# Patient Record
Sex: Male | Born: 1970 | Race: White | Hispanic: No | Marital: Single | Smoking: Never smoker
Health system: Southern US, Community
[De-identification: ages and names within clinical notes are randomized; demographics above are authoritative.]

## PROBLEM LIST (undated history)

## (undated) DIAGNOSIS — E7800 Pure hypercholesterolemia, unspecified: Secondary | ICD-10-CM

## (undated) DIAGNOSIS — K829 Disease of gallbladder, unspecified: Secondary | ICD-10-CM

## (undated) DIAGNOSIS — I639 Cerebral infarction, unspecified: Secondary | ICD-10-CM

## (undated) DIAGNOSIS — E119 Type 2 diabetes mellitus without complications: Secondary | ICD-10-CM

## (undated) DIAGNOSIS — N189 Chronic kidney disease, unspecified: Secondary | ICD-10-CM

## (undated) DIAGNOSIS — M109 Gout, unspecified: Secondary | ICD-10-CM

## (undated) DIAGNOSIS — N200 Calculus of kidney: Secondary | ICD-10-CM

## (undated) DIAGNOSIS — I10 Essential (primary) hypertension: Secondary | ICD-10-CM

## (undated) HISTORY — PX: WISDOM TOOTH EXTRACTION: SHX21

---

## 2004-05-01 DIAGNOSIS — I639 Cerebral infarction, unspecified: Secondary | ICD-10-CM

## 2004-05-01 HISTORY — DX: Cerebral infarction, unspecified: I63.9

## 2015-02-25 ENCOUNTER — Emergency Department (HOSPITAL_BASED_OUTPATIENT_CLINIC_OR_DEPARTMENT_OTHER): Payer: 59

## 2015-02-25 ENCOUNTER — Encounter (HOSPITAL_BASED_OUTPATIENT_CLINIC_OR_DEPARTMENT_OTHER): Payer: Self-pay | Admitting: *Deleted

## 2015-02-25 ENCOUNTER — Emergency Department (HOSPITAL_BASED_OUTPATIENT_CLINIC_OR_DEPARTMENT_OTHER)
Admission: EM | Admit: 2015-02-25 | Discharge: 2015-02-25 | Disposition: A | Discharge status: Home / Self Care | Payer: 59 | Attending: Emergency Medicine | Admitting: Emergency Medicine

## 2015-02-25 DIAGNOSIS — N201 Calculus of ureter: Secondary | ICD-10-CM | POA: Insufficient documentation

## 2015-02-25 DIAGNOSIS — R1032 Left lower quadrant pain: Secondary | ICD-10-CM | POA: Diagnosis present

## 2015-02-25 DIAGNOSIS — R109 Unspecified abdominal pain: Secondary | ICD-10-CM

## 2015-02-25 LAB — CBC WITH DIFFERENTIAL/PLATELET
Basophils Absolute: 0 10*3/uL (ref 0.0–0.1)
Basophils Relative: 0 %
Eosinophils Absolute: 0.2 10*3/uL (ref 0.0–0.7)
Eosinophils Relative: 2 %
HCT: 45.5 % (ref 39.0–52.0)
HEMOGLOBIN: 15.3 g/dL (ref 13.0–17.0)
LYMPHS ABS: 1.9 10*3/uL (ref 0.7–4.0)
LYMPHS PCT: 20 %
MCH: 27.4 pg (ref 26.0–34.0)
MCHC: 33.6 g/dL (ref 30.0–36.0)
MCV: 81.5 fL (ref 78.0–100.0)
MONOS PCT: 6 %
Monocytes Absolute: 0.6 10*3/uL (ref 0.1–1.0)
NEUTROS PCT: 72 %
Neutro Abs: 6.8 10*3/uL (ref 1.7–7.7)
Platelets: 202 10*3/uL (ref 150–400)
RBC: 5.58 MIL/uL (ref 4.22–5.81)
RDW: 13.3 % (ref 11.5–15.5)
WBC: 9.4 10*3/uL (ref 4.0–10.5)

## 2015-02-25 LAB — URINALYSIS, ROUTINE W REFLEX MICROSCOPIC
BILIRUBIN URINE: NEGATIVE
GLUCOSE, UA: 100 mg/dL — AB
Ketones, ur: 15 mg/dL — AB
Leukocytes, UA: NEGATIVE
Nitrite: NEGATIVE
PROTEIN: NEGATIVE mg/dL
Specific Gravity, Urine: 1.015 (ref 1.005–1.030)
UROBILINOGEN UA: 0.2 mg/dL (ref 0.0–1.0)
pH: 5.5 (ref 5.0–8.0)

## 2015-02-25 LAB — URINE MICROSCOPIC-ADD ON

## 2015-02-25 LAB — COMPREHENSIVE METABOLIC PANEL
ALK PHOS: 78 U/L (ref 38–126)
ALT: 67 U/L — AB (ref 17–63)
ANION GAP: 6 (ref 5–15)
AST: 64 U/L — ABNORMAL HIGH (ref 15–41)
Albumin: 4.1 g/dL (ref 3.5–5.0)
BUN: 8 mg/dL (ref 6–20)
CALCIUM: 8.9 mg/dL (ref 8.9–10.3)
CO2: 25 mmol/L (ref 22–32)
CREATININE: 0.97 mg/dL (ref 0.61–1.24)
Chloride: 105 mmol/L (ref 101–111)
Glucose, Bld: 228 mg/dL — ABNORMAL HIGH (ref 65–99)
Potassium: 4.1 mmol/L (ref 3.5–5.1)
Sodium: 136 mmol/L (ref 135–145)
TOTAL PROTEIN: 8.1 g/dL (ref 6.5–8.1)
Total Bilirubin: 1 mg/dL (ref 0.3–1.2)

## 2015-02-25 LAB — LIPASE, BLOOD: LIPASE: 30 U/L (ref 11–51)

## 2015-02-25 MED ORDER — ONDANSETRON HCL 8 MG PO TABS
4.0000 mg | ORAL_TABLET | Freq: Once | ORAL | Status: AC
  Administered 2015-02-25: 4 mg via ORAL
  Filled 2015-02-25: qty 1

## 2015-02-25 MED ORDER — ONDANSETRON HCL 4 MG PO TABS: 4.0000 mg | ORAL_TABLET | Freq: Three times a day (TID) | ORAL | Status: DC | PRN

## 2015-02-25 MED ORDER — ONDANSETRON HCL 4 MG/2ML IJ SOLN
4.0000 mg | Freq: Once | INTRAMUSCULAR | Status: AC
  Administered 2015-02-25: 4 mg via INTRAVENOUS
  Filled 2015-02-25: qty 2

## 2015-02-25 MED ORDER — OXYCODONE-ACETAMINOPHEN 5-325 MG PO TABS: 1.0000 | ORAL_TABLET | Freq: Four times a day (QID) | ORAL | Status: DC | PRN

## 2015-02-25 MED ORDER — MORPHINE SULFATE (PF) 4 MG/ML IV SOLN
4.0000 mg | Freq: Once | INTRAVENOUS | Status: AC
  Administered 2015-02-25: 4 mg via INTRAVENOUS
  Filled 2015-02-25: qty 1

## 2015-02-25 MED ORDER — OXYCODONE-ACETAMINOPHEN 5-325 MG PO TABS
1.0000 | ORAL_TABLET | Freq: Once | ORAL | Status: AC
  Administered 2015-02-25: 1 via ORAL
  Filled 2015-02-25: qty 1

## 2015-02-25 NOTE — ED Notes (Signed)
Patient transported to Ultrasound 

## 2015-02-25 NOTE — ED Provider Notes (Signed)
CSN: [id number]     Arrival date & time 02/25/15  4098 History   First MD Initiated Contact with Patient 02/25/15 0902     Chief Complaint  Patient presents with  . Abdominal Pain     (Consider location/radiation/quality/duration/timing/severity/associated sxs/prior Treatment) Patient is a 44 y.o. male presenting with abdominal pain.  Abdominal Pain Pain location:  LLQ (Left groin) Pain quality: sharp   Pain radiates to:  Groin Pain severity:  Severe Onset quality:  Sudden Duration:  2 hours Timing:  Constant Progression:  Worsening Chronicity:  New Context comment:   pain first began when he was driving to work this morning. Prior to that, he felt well. Relieved by:  Nothing Worsened by:  Nothing tried Associated symptoms: nausea and vomiting   Associated symptoms: no constipation, no diarrhea, no fever and no hematuria     History reviewed. No pertinent past medical history. History reviewed. No pertinent past surgical history. No family history on file. Social History  Substance Use Topics  . Smoking status: None  . Smokeless tobacco: None  . Alcohol Use: None    Review of Systems  Constitutional: Negative for fever.  Gastrointestinal: Positive for nausea, vomiting and abdominal pain. Negative for diarrhea and constipation.  Genitourinary: Negative for hematuria.  All other systems reviewed and are negative.     Allergies  Review of patient's allergies indicates no known allergies.  Home Medications   Prior to Admission medications   Not on File   There were no vitals taken for this visit. Physical Exam  Constitutional: He is oriented to person, place, and time. He appears well-developed and well-nourished. No distress.  Uncomfortable  HENT:  Head: Normocephalic and atraumatic.  Mouth/Throat: Oropharynx is clear and moist.  Eyes: Conjunctivae are normal. Pupils are equal, round, and reactive to light. No scleral icterus.  Neck: Neck supple.    Cardiovascular: Normal rate, regular rhythm, normal heart sounds and intact distal pulses.   No murmur heard. Pulmonary/Chest: Effort normal and breath sounds normal. No stridor. No respiratory distress. He has no wheezes. He has no rales.  Abdominal: Soft. He exhibits no distension. There is no tenderness. Hernia confirmed negative in the right inguinal area and confirmed negative in the left inguinal area.    Genitourinary: Penis normal. Right testis shows no mass and no tenderness. Left testis shows tenderness. Left testis shows no mass. No discharge found.  Musculoskeletal: Normal range of motion. He exhibits no edema.  Neurological: He is alert and oriented to person, place, and time.  Skin: Skin is warm and dry. No rash noted.  Psychiatric: He has a normal mood and affect. His behavior is normal.  Nursing note and vitals reviewed.   ED Course  Procedures (including critical care time) Labs Review Labs Reviewed  COMPREHENSIVE METABOLIC PANEL - Abnormal; Notable for the following:    Glucose, Bld 228 (*)    AST 64 (*)    ALT 67 (*)    All other components within normal limits  URINALYSIS, ROUTINE W REFLEX MICROSCOPIC (NOT AT [facility]) - Abnormal; Notable for the following:    APPearance CLOUDY (*)    Glucose, UA 100 (*)    Hgb urine dipstick LARGE (*)    Ketones, ur 15 (*)    All other components within normal limits  URINE MICROSCOPIC-ADD ON - Abnormal; Notable for the following:    Bacteria, UA FEW (*)    All other components within normal limits  CBC WITH DIFFERENTIAL/PLATELET  LIPASE, BLOOD  Imaging Review [place] Scrotum  02/25/2015  CLINICAL DATA:  Left inguinal and scrotal region pain EXAM: SCROTAL ULTRASOUND DOPPLER ULTRASOUND OF THE TESTICLES TECHNIQUE: Complete ultrasound examination of the testicles, epididymis, and other scrotal structures was performed. Color and spectral Doppler ultrasound were also utilized to evaluate blood flow to the testicles. COMPARISON:   None. FINDINGS: Right testicle Measurements: 4.0 x 2.1 x 2.6 cm. No mass or microlithiasis visualized. Left testicle Measurements: 4.0 x 2.1 x 2.8 cm. No mass or microlithiasis visualized. Right epididymis: There is a 3 mm cyst in the head of the right epididymis. No inflammatory foci. The overall size and contour of the right epididymis is normal. Left epididymis:  Normal in size and appearance. Hydrocele: Minimal hydrocele on the left. No appreciable hydrocele on the right. Varicocele:  None visualized. Pulsed Doppler interrogation of both testes demonstrates normal low resistance arterial and venous waveforms bilaterally. The peak systolic velocity of the left testis is 5 cm/sec. The peak systolic velocity of the right testis is 5 cm/sec. There is no scrotal abscess or wall thickening on either side. IMPRESSION: Minimal left-sided hydrocele. 3 mm epididymal head cyst. Study otherwise unremarkable. No inflammatory foci identified. No intratesticular mass or torsion. Electronically Signed   By: Bretta BangWilliam  Woodruff III M.D.   On: 02/25/2015 10:19   [place] Art/ven Flow Abd Pelv Doppler  02/25/2015  CLINICAL DATA:  Left inguinal and scrotal region pain EXAM: SCROTAL ULTRASOUND DOPPLER ULTRASOUND OF THE TESTICLES TECHNIQUE: Complete ultrasound examination of the testicles, epididymis, and other scrotal structures was performed. Color and spectral Doppler ultrasound were also utilized to evaluate blood flow to the testicles. COMPARISON:  None. FINDINGS: Right testicle Measurements: 4.0 x 2.1 x 2.6 cm. No mass or microlithiasis visualized. Left testicle Measurements: 4.0 x 2.1 x 2.8 cm. No mass or microlithiasis visualized. Right epididymis: There is a 3 mm cyst in the head of the right epididymis. No inflammatory foci. The overall size and contour of the right epididymis is normal. Left epididymis:  Normal in size and appearance. Hydrocele: Minimal hydrocele on the left. No appreciable hydrocele on the right. Varicocele:   None visualized. Pulsed Doppler interrogation of both testes demonstrates normal low resistance arterial and venous waveforms bilaterally. The peak systolic velocity of the left testis is 5 cm/sec. The peak systolic velocity of the right testis is 5 cm/sec. There is no scrotal abscess or wall thickening on either side. IMPRESSION: Minimal left-sided hydrocele. 3 mm epididymal head cyst. Study otherwise unremarkable. No inflammatory foci identified. No intratesticular mass or torsion. Electronically Signed   By: Bretta BangWilliam  Woodruff III M.D.   On: 02/25/2015 10:19   Ct Renal Stone Study  02/25/2015  CLINICAL DATA:  Sudden onset left flank pain EXAM: CT ABDOMEN AND PELVIS WITHOUT CONTRAST TECHNIQUE: Multidetector CT imaging of the abdomen and pelvis was performed following the standard protocol without IV contrast. COMPARISON:  None. FINDINGS: Lower chest and abdominal wall:  Symmetric gynecomastia, mild. Coronary atherosclerosis, multi focal in the left circulation, age advanced. Hepatobiliary: Hepatic steatosis with central sparing.Rounded stone subtly present within the gallbladder lumen; no inflammatory changes. Pancreas: Unremarkable. Spleen: Unremarkable. Adrenals/Urinary Tract:  Negative adrenals. 7 mm stone at the left ureteral pelvic junction with mild left hydronephrosis and moderate asymmetric left perinephric edema. No additional urolithiasis. No right hydronephrosis. Unremarkable bladder. Reproductive:Negative. Stomach/Bowel: Extensive colonic diverticulosis for age, especially in the sigmoid. No obstruction. Vascular/Lymphatic: No acute vascular abnormality. Enlarged lymph nodes in the deep liver drainage, usually incidental/ reactive in isolation. Peritoneal: No  ascites or pneumoperitoneum. Musculoskeletal: Degenerative disc disease focally advanced at L5-S1 with endplate spurs causing advanced bilateral foraminal stenosis when combined with facet spurring. IMPRESSION: 1. Obstructing 7 mm stone at the  left UPJ. 2. Cholelithiasis. 3. Hepatic steatosis. 4. Colonic diverticulosis. 5. Premature coronary atherosclerosis. Electronically Signed   By: Marnee Spring M.D.   On: 02/25/2015 11:48   I have personally reviewed and evaluated these images and lab results as part of my medical decision-making.   EKG Interpretation None      MDM   Final diagnoses:  Left groin pain  Left sided abdominal pain  Left ureteral stone    Sudden onset left abdominal/left groin pain associated with vomiting. Patient very uncomfortable on exam. Exam shows tenderness to his left testicle, therefore will need to proceed with ultrasound to rule out testicular torsion. If negative, his symptoms could be secondary to kidney stones. Plan CT imaging if ultrasound negative. He notes that his symptoms do not seem the same as when he has had kidney stones in the past.  IV morphine, IV Zofran for symptom control.  Workup shows large left UPJ stone (7mm).  Pain and nausea were controlled.  UA without signs of infection.  Good renal function. Discussed case with Dr. Vernie Ammons who will help to secure him close follow-up.  Blake Divine, MD 02/25/15 [phone]

## 2015-02-25 NOTE — Discharge Instructions (Signed)
Kidney Stones °Kidney stones (urolithiasis) are deposits that form inside your kidneys. The intense pain is caused by the stone moving through the urinary tract. When the stone moves, the ureter goes into spasm around the stone. The stone is usually passed in the urine.  °CAUSES  °· A disorder that makes certain neck glands produce too much parathyroid hormone (primary hyperparathyroidism). °· A buildup of uric acid crystals, similar to gout in your joints. °· Narrowing (stricture) of the ureter. °· A kidney obstruction present at birth (congenital obstruction). °· Previous surgery on the kidney or ureters. °· Numerous kidney infections. °SYMPTOMS  °· Feeling sick to your stomach (nauseous). °· Throwing up (vomiting). °· Blood in the urine (hematuria). °· Pain that usually spreads (radiates) to the groin. °· Frequency or urgency of urination. °DIAGNOSIS  °· Taking a history and physical exam. °· Blood or urine tests. °· CT scan. °· Occasionally, an examination of the inside of the urinary bladder (cystoscopy) is performed. °TREATMENT  °· Observation. °· Increasing your fluid intake. °· Extracorporeal shock wave lithotripsy--This is a noninvasive procedure that uses shock waves to break up kidney stones. °· Surgery may be needed if you have severe pain or persistent obstruction. There are various surgical procedures. Most of the procedures are performed with the use of small instruments. Only small incisions are needed to accommodate these instruments, so recovery time is minimized. °The size, location, and chemical composition are all important variables that will determine the proper choice of action for you. Talk to your health care provider to better understand your situation so that you will minimize the risk of injury to yourself and your kidney.  °HOME CARE INSTRUCTIONS  °· Drink enough water and fluids to keep your urine clear or pale yellow. This will help you to pass the stone or stone fragments. °· Strain  all urine through the provided strainer. Keep all particulate matter and stones for your health care provider to see. The stone causing the pain may be as small as a grain of salt. It is very important to use the strainer each and every time you pass your urine. The collection of your stone will allow your health care provider to analyze it and verify that a stone has actually passed. The stone analysis will often identify what you can do to reduce the incidence of recurrences. °· Only take over-the-counter or prescription medicines for pain, discomfort, or fever as directed by your health care provider. °· Keep all follow-up visits as told by your health care provider. This is important. °· Get follow-up X-rays if required. The absence of pain does not always mean that the stone has passed. It may have only stopped moving. If the urine remains completely obstructed, it can cause loss of kidney function or even complete destruction of the kidney. It is your responsibility to make sure X-rays and follow-ups are completed. Ultrasounds of the kidney can show blockages and the status of the kidney. Ultrasounds are not associated with any radiation and can be performed easily in a matter of minutes. °· Make changes to your daily diet as told by your health care provider. You may be told to: °¨ Limit the amount of salt that you eat. °¨ Eat 5 or more servings of fruits and vegetables each day. °¨ Limit the amount of meat, poultry, fish, and eggs that you eat. °· Collect a 24-hour urine sample as told by your health care provider. You may need to collect another urine sample every 6-12   months. °SEEK MEDICAL CARE IF: °· You experience pain that is progressive and unresponsive to any pain medicine you have been prescribed. °SEEK IMMEDIATE MEDICAL CARE IF:  °· Pain cannot be controlled with the prescribed medicine. °· You have a fever or shaking chills. °· The severity or intensity of pain increases over 18 hours and is not  relieved by pain medicine. °· You develop a new onset of abdominal pain. °· You feel faint or pass out. °· You are unable to urinate. °  °This information is not intended to replace advice given to you by your health care provider. Make sure you discuss any questions you have with your health care provider. °  °Document Released: 04/17/2005 Document Revised: 01/06/2015 Document Reviewed: 09/18/2012 °Elsevier Interactive Patient Education ©2016 [employer] ° °

## 2015-02-25 NOTE — ED Notes (Signed)
Pt amb to room 6 with quick steady gait, pt reports sudden onset of left upper abd pain radiating to his lower left abd and into his groin, pt states the pain is intermittent, and so intense that it made him vomit. Pain now 6/10, was 10/10.

## 2015-03-11 ENCOUNTER — Other Ambulatory Visit: Payer: Self-pay | Admitting: Urology

## 2015-03-12 ENCOUNTER — Encounter (HOSPITAL_COMMUNITY): Payer: Self-pay | Admitting: *Deleted

## 2015-03-15 ENCOUNTER — Ambulatory Visit (HOSPITAL_COMMUNITY)
Admission: RE | Admit: 2015-03-15 | Discharge: 2015-03-15 | Disposition: A | Discharge status: Home / Self Care | Payer: 59 | Source: Ambulatory Visit | Attending: Urology | Admitting: Urology

## 2015-03-15 ENCOUNTER — Encounter (HOSPITAL_COMMUNITY)
Admission: RE | Disposition: A | Discharge status: Home / Self Care | Payer: Self-pay | Source: Ambulatory Visit | Attending: Urology

## 2015-03-15 ENCOUNTER — Encounter (HOSPITAL_COMMUNITY): Payer: Self-pay | Admitting: General Practice

## 2015-03-15 ENCOUNTER — Ambulatory Visit (HOSPITAL_COMMUNITY): Payer: 59

## 2015-03-15 DIAGNOSIS — I10 Essential (primary) hypertension: Secondary | ICD-10-CM | POA: Diagnosis not present

## 2015-03-15 DIAGNOSIS — K8080 Other cholelithiasis without obstruction: Secondary | ICD-10-CM | POA: Diagnosis not present

## 2015-03-15 DIAGNOSIS — M109 Gout, unspecified: Secondary | ICD-10-CM | POA: Diagnosis not present

## 2015-03-15 DIAGNOSIS — Z87442 Personal history of urinary calculi: Secondary | ICD-10-CM | POA: Insufficient documentation

## 2015-03-15 DIAGNOSIS — Z841 Family history of disorders of kidney and ureter: Secondary | ICD-10-CM | POA: Diagnosis not present

## 2015-03-15 DIAGNOSIS — N201 Calculus of ureter: Secondary | ICD-10-CM | POA: Diagnosis present

## 2015-03-15 DIAGNOSIS — Z79891 Long term (current) use of opiate analgesic: Secondary | ICD-10-CM | POA: Insufficient documentation

## 2015-03-15 DIAGNOSIS — Z79899 Other long term (current) drug therapy: Secondary | ICD-10-CM | POA: Diagnosis not present

## 2015-03-15 DIAGNOSIS — Z8673 Personal history of transient ischemic attack (TIA), and cerebral infarction without residual deficits: Secondary | ICD-10-CM | POA: Insufficient documentation

## 2015-03-15 HISTORY — DX: Chronic kidney disease, unspecified: N18.9

## 2015-03-15 HISTORY — DX: Cerebral infarction, unspecified: I63.9

## 2015-03-15 HISTORY — DX: Gout, unspecified: M10.9

## 2015-03-15 HISTORY — DX: Calculus of kidney: N20.0

## 2015-03-15 SURGERY — LITHOTRIPSY, ESWL
Anesthesia: LOCAL | Laterality: Left

## 2015-03-15 MED ORDER — DIPHENHYDRAMINE HCL 25 MG PO CAPS
25.0000 mg | ORAL_CAPSULE | ORAL | Status: AC
  Administered 2015-03-15: 25 mg via ORAL
  Filled 2015-03-15: qty 1

## 2015-03-15 MED ORDER — DIAZEPAM 5 MG PO TABS
10.0000 mg | ORAL_TABLET | ORAL | Status: AC
  Administered 2015-03-15: 10 mg via ORAL
  Filled 2015-03-15: qty 2

## 2015-03-15 MED ORDER — SODIUM CHLORIDE 0.9 % IV SOLN
INTRAVENOUS | Status: DC
  Administered 2015-03-15: 10:00:00 via INTRAVENOUS

## 2015-03-15 MED ORDER — CIPROFLOXACIN HCL 500 MG PO TABS
500.0000 mg | ORAL_TABLET | ORAL | Status: AC
  Administered 2015-03-15: 500 mg via ORAL
  Filled 2015-03-15: qty 1

## 2015-03-15 NOTE — Progress Notes (Signed)
Procedure cancelled on mobile lithotripsy van, pt.'s BP elevated on litho van, pt. Does not take any BP medications, pt. To follow-up with primary doctor regarding BP. Pt. Had sedation  On litho van. Pt. To reschedule with Dr. Jorja LoaBudzyn's office.

## 2017-01-11 ENCOUNTER — Observation Stay (HOSPITAL_BASED_OUTPATIENT_CLINIC_OR_DEPARTMENT_OTHER)
Admission: EM | Admit: 2017-01-11 | Discharge: 2017-01-13 | Disposition: A | Discharge status: Home / Self Care | Payer: 59 | Attending: Internal Medicine | Admitting: Internal Medicine

## 2017-01-11 ENCOUNTER — Encounter (HOSPITAL_BASED_OUTPATIENT_CLINIC_OR_DEPARTMENT_OTHER): Payer: Self-pay

## 2017-01-11 ENCOUNTER — Emergency Department (HOSPITAL_BASED_OUTPATIENT_CLINIC_OR_DEPARTMENT_OTHER): Payer: 59

## 2017-01-11 DIAGNOSIS — R29810 Facial weakness: Secondary | ICD-10-CM | POA: Insufficient documentation

## 2017-01-11 DIAGNOSIS — Z8673 Personal history of transient ischemic attack (TIA), and cerebral infarction without residual deficits: Secondary | ICD-10-CM | POA: Insufficient documentation

## 2017-01-11 DIAGNOSIS — N189 Chronic kidney disease, unspecified: Secondary | ICD-10-CM | POA: Insufficient documentation

## 2017-01-11 DIAGNOSIS — Z7982 Long term (current) use of aspirin: Secondary | ICD-10-CM | POA: Diagnosis not present

## 2017-01-11 DIAGNOSIS — Z79899 Other long term (current) drug therapy: Secondary | ICD-10-CM | POA: Insufficient documentation

## 2017-01-11 DIAGNOSIS — E1122 Type 2 diabetes mellitus with diabetic chronic kidney disease: Secondary | ICD-10-CM | POA: Insufficient documentation

## 2017-01-11 DIAGNOSIS — Z8249 Family history of ischemic heart disease and other diseases of the circulatory system: Secondary | ICD-10-CM | POA: Diagnosis not present

## 2017-01-11 DIAGNOSIS — Z833 Family history of diabetes mellitus: Secondary | ICD-10-CM | POA: Diagnosis not present

## 2017-01-11 DIAGNOSIS — I129 Hypertensive chronic kidney disease with stage 1 through stage 4 chronic kidney disease, or unspecified chronic kidney disease: Secondary | ICD-10-CM | POA: Diagnosis not present

## 2017-01-11 DIAGNOSIS — Z7984 Long term (current) use of oral hypoglycemic drugs: Secondary | ICD-10-CM | POA: Diagnosis not present

## 2017-01-11 DIAGNOSIS — M109 Gout, unspecified: Secondary | ICD-10-CM | POA: Insufficient documentation

## 2017-01-11 DIAGNOSIS — R079 Chest pain, unspecified: Secondary | ICD-10-CM | POA: Diagnosis not present

## 2017-01-11 DIAGNOSIS — Z87442 Personal history of urinary calculi: Secondary | ICD-10-CM | POA: Diagnosis not present

## 2017-01-11 LAB — CBC WITH DIFFERENTIAL/PLATELET
BASOS PCT: 0 %
Basophils Absolute: 0 10*3/uL (ref 0.0–0.1)
EOS ABS: 0.1 10*3/uL (ref 0.0–0.7)
EOS PCT: 1 %
HCT: 42.5 % (ref 39.0–52.0)
HEMOGLOBIN: 14.7 g/dL (ref 13.0–17.0)
Lymphocytes Relative: 20 %
Lymphs Abs: 2.2 10*3/uL (ref 0.7–4.0)
MCH: 28 pg (ref 26.0–34.0)
MCHC: 34.6 g/dL (ref 30.0–36.0)
MCV: 81 fL (ref 78.0–100.0)
MONOS PCT: 6 %
Monocytes Absolute: 0.6 10*3/uL (ref 0.1–1.0)
NEUTROS PCT: 73 %
Neutro Abs: 7.8 10*3/uL — ABNORMAL HIGH (ref 1.7–7.7)
PLATELETS: 180 10*3/uL (ref 150–400)
RBC: 5.25 MIL/uL (ref 4.22–5.81)
RDW: 13.2 % (ref 11.5–15.5)
WBC: 10.7 10*3/uL — ABNORMAL HIGH (ref 4.0–10.5)

## 2017-01-11 LAB — COMPREHENSIVE METABOLIC PANEL
ALBUMIN: 4 g/dL (ref 3.5–5.0)
ALT: 50 U/L (ref 17–63)
ANION GAP: 9 (ref 5–15)
AST: 41 U/L (ref 15–41)
Alkaline Phosphatase: 80 U/L (ref 38–126)
BUN: 9 mg/dL (ref 6–20)
CHLORIDE: 99 mmol/L — AB (ref 101–111)
CO2: 24 mmol/L (ref 22–32)
Calcium: 8.9 mg/dL (ref 8.9–10.3)
Creatinine, Ser: 0.77 mg/dL (ref 0.61–1.24)
GFR calc Af Amer: >60 mL/min (ref >60)
GFR calc non Af Amer: >60 mL/min (ref >60)
GLUCOSE: 222 mg/dL — AB (ref 65–99)
POTASSIUM: 4.2 mmol/L (ref 3.5–5.1)
SODIUM: 132 mmol/L — AB (ref 135–145)
Total Bilirubin: 0.3 mg/dL (ref 0.3–1.2)
Total Protein: 7.7 g/dL (ref 6.5–8.1)

## 2017-01-11 LAB — TROPONIN I
Troponin I: <0.03 ng/mL (ref <0.03)
Troponin I: <0.03 ng/mL (ref <0.03)

## 2017-01-11 MED ORDER — ASPIRIN 81 MG PO CHEW
324.0000 mg | CHEWABLE_TABLET | Freq: Once | ORAL | Status: AC
  Administered 2017-01-11: 324 mg via ORAL
  Filled 2017-01-11: qty 4

## 2017-01-11 MED ORDER — NITROGLYCERIN 0.4 MG SL SUBL
0.4000 mg | SUBLINGUAL_TABLET | SUBLINGUAL | Status: DC | PRN
  Administered 2017-01-11: 0.4 mg via SUBLINGUAL
  Filled 2017-01-11: qty 1

## 2017-01-11 NOTE — ED Provider Notes (Signed)
[facility] Provider Note   CSN: [id number] Arrival date & time: 01/11/17  1926     History   Chief Complaint Chief Complaint  Patient presents with  . Chest Pain    HPI Tony Bryan is a 46 y.o. male.  HPI   [age]yo male presents with concern for left sided chest pain intermittently for 3 days. Radiated to neck, arm. Associated pressure and tingling.  Today arm felt a heaviness, tingling and numbness with chest pressure. Could move arm ok, but it just had a sensation of being heavy. Feels chest pressure.  Was coming and going over the last few days. Was taking aspirin and it seemed to subside, sometimes better with rest. Not necessarily worse with exertion however.  Was at work the last few days and didn' t notice it but when got home did notice it.   Mild associated shortness of breath.  No nausea, no vomiting, no sweating. No difficulty speaking, trouble walking, weakness/difficulty moving arm or legs.    No prior hx of chest pain, no hx of stress test.  Hx of TIA when 34-had trouble speaking and facial droop, was admitted to the hospital for 3 days  No htn, hlpd, DM, no smoking No recent surgeries, had colonoscopy this year Mom had hx of CHF, no known hx of MI   Past Medical History:  Diagnosis Date  . Chronic kidney disease   . Gout    right ankle  . Renal stones   . Stroke [facility]) 2006   TIA    Patient Active Problem List   Diagnosis Date Noted  . Chest pain 01/11/2017    Past Surgical History:  Procedure Laterality Date  . WISDOM TOOTH EXTRACTION         Home Medications    Prior to Admission medications   Not on File    Family History History reviewed. No pertinent family history.  Social History Social History  Substance Use Topics  . Smoking status: Never Smoker  . Smokeless tobacco: Never Used  . Alcohol use Yes     Comment: occ     Allergies   Patient has no known allergies.   Review of Systems Review of Systems    Constitutional: Negative for fever.  HENT: Negative for sore throat.   Eyes: Negative for visual disturbance.  Respiratory: Positive for shortness of breath. Negative for cough.   Cardiovascular: Positive for chest pain. Negative for leg swelling.  Gastrointestinal: Negative for abdominal pain, nausea and vomiting.  Genitourinary: Negative for difficulty urinating and dysuria.  Musculoskeletal: Negative for back pain and neck stiffness.  Skin: Negative for rash.  Neurological: Positive for light-headedness and numbness (tingling). Negative for syncope and headaches.     Physical Exam Updated Vital Signs BP 135/89   Pulse 69   Temp 98.4 F (36.9 C) (Oral)   Resp 17   Ht 5\' 10"  (1.778 m)   Wt 119.7 kg (263 lb 14.3 oz)   SpO2 99%   BMI 37.86 kg/m   Physical Exam  Constitutional: He is oriented to person, place, and time. He appears well-developed and well-nourished. No distress.  HENT:  Head: Normocephalic and atraumatic.  Eyes: Conjunctivae and EOM are normal.  Neck: Normal range of motion.  Cardiovascular: Normal rate, regular rhythm, normal heart sounds and intact distal pulses.  Exam reveals no gallop and no friction rub.   No murmur heard. Pulmonary/Chest: Effort normal and breath sounds normal. No respiratory distress. He has no  wheezes. He has no rales. He exhibits no tenderness.  Abdominal: Soft. He exhibits no distension. There is no tenderness. There is no guarding.  Musculoskeletal: He exhibits no edema.  Neurological: He is alert and oriented to person, place, and time.  Skin: Skin is warm and dry. He is not diaphoretic.  Nursing note and vitals reviewed.    ED Treatments / Results  Labs (all labs ordered are listed, but only abnormal results are displayed) Labs Reviewed  CBC WITH DIFFERENTIAL/PLATELET - Abnormal; Notable for the following:       Result Value   WBC 10.7 (*)    Neutro Abs 7.8 (*)    All other components within normal limits  COMPREHENSIVE  METABOLIC PANEL - Abnormal; Notable for the following:    Sodium 132 (*)    Chloride 99 (*)    Glucose, Bld 222 (*)    All other components within normal limits  TROPONIN I  TROPONIN I  TROPONIN I    EKG  EKG Interpretation  Date/Time:  Thursday January 11 2017 19:33:25 EDT Ventricular Rate:  86 PR Interval:  142 QRS Duration: 82 QT Interval:  342 QTC Calculation: 409 R Axis:   28 Text Interpretation:  Normal sinus rhythm Cannot rule out Anterior infarct , age undetermined Abnormal ECG No previous ECGs available Confirmed by Alvira Monday ([id number]) on 01/11/2017 7:53:03 PM       Radiology Dg Chest 2 View  Result Date: 01/11/2017 CLINICAL DATA:  Chest pain and tightness. EXAM: CHEST  2 VIEW COMPARISON:  None. FINDINGS: Normal heart size and mediastinal contours. There is artifact from EKG pads, including over the upper left chest. There is no edema, consolidation, effusion, or pneumothorax. No acute osseous finding. IMPRESSION: Negative chest. Electronically Signed   By: Marnee Spring M.D.   On: 01/11/2017 20:41    Procedures Procedures (including critical care time)  Medications Ordered in ED Medications  nitroGLYCERIN (NITROSTAT) SL tablet 0.4 mg (0.4 mg Sublingual Given 01/11/17 2048)  aspirin chewable tablet 324 mg (324 mg Oral Given 01/11/17 2003)     Initial Impression / Assessment and Plan / ED Course  I have reviewed the triage vital signs and the nursing notes.  Pertinent labs & imaging results that were available during my care of the patient were reviewed by me and considered in my medical decision making (see chart for details).     46 year old male presents with concern for 3 days of intermittent left-sided chest pressure with radiation to the arm and neck with associated shortness of breath. EKG shows age indeterminate anterior changes.  Troponin negative.  CXR without acute abnormalities.  Patient low-risk Wells and perc negative and her low suspicion  for pulmonary embolus. He has strong bilateral upper and lower certainly pulses, normal CHEST X-RAY, the history, physical or imaging are not consistent with aortic dissection.  Given his elevated BMI, history of pain, patient's heart score is 4 on my evaluation. Also question whether he has DM given elevated glucose 222.  Discussed this with patient and his sister. Do not have other explanation for chest pain at this time.  HEART score 4, will plan on observation admission.   He is currently awaiting admission at [facility], Dr. Robb Matar.    Final Clinical Impressions(s) / ED Diagnoses   Final diagnoses:  Nonspecific chest pain    New Prescriptions New Prescriptions   No medications on file     Alvira Monday, MD 01/12/17 [phone]

## 2017-01-11 NOTE — ED Triage Notes (Signed)
C/o CP x 3 days-NAD-steady gait 

## 2017-01-11 NOTE — ED Notes (Signed)
Chest tightness since Monday, with intermittent ShOB. Pt reports that sometimes the pain gets better with ASA.

## 2017-01-11 NOTE — Plan of Care (Signed)
Accepted to observation/telemetry bed for chest pain monitoring and evaluation.   Per Dr. Dalene SeltzerSchlossman,  Chief Complaint    Chief Complaint  Patient presents with  . Chest Pain    HPI Tony Bryan is a 46 y.o. male.  HPI   46yo male presents with concern for left sided chest pain intermittently for 3 days. Radiated to neck, arm. Associated pressure and tingling.  Today arm felt a heaviness, tingling and numbness with chest pressure. Could move arm ok, but it just had a sensation of being heavy. Feels chest pressure.  Was coming and going over the last few days. Was taking aspirin and it seemed to subside, sometimes better with rest. Not necessarily worse with exertion however.  Was at work the last few days and didn' t notice it but when got home did notice it.   Mild associated shortness of breath.  No nausea, no vomiting, no sweating. No difficulty speaking, trouble walking, weakness/difficulty moving arm or legs.    No prior hx of chest pain, no hx of stress test.  Hx of TIA when 34-had trouble speaking and facial droop, was admitted to the hospital for 3 days  No htn, hlpd, DM, no smoking No recent surgeries, had colonoscopy this year Mom had hx of CHF, no known hx of MI  Troponin I [[id number]][[id number]] Collected: 01/11/17 2006  Updated: 01/11/17 2042   Specimen Type: Blood   Specimen Source: Vein    Troponin I <0.03 ng/mL  Comprehensive metabolic panel [[id number]][[id number]] (Abnormal) Collected: 01/11/17 2006  Updated: 01/11/17 2038   Specimen Type: Blood   Specimen Source: Vein    Sodium 132 (L) mmol/L   Potassium 4.2 mmol/L   Chloride 99 (L) mmol/L   CO2 24 mmol/L   Glucose, Bld 222 (H) mg/dL   BUN 9 mg/dL   Creatinine, Ser 6.290.77 mg/dL   Calcium 8.9 mg/dL   Total Protein 7.7 g/dL   Albumin 4.0 g/dL   AST 41 U/L   ALT 50 U/L   Alkaline Phosphatase 80 U/L   Total Bilirubin 0.3 mg/dL   GFR calc non Af Amer >60 mL/min   GFR calc Af Amer >60 mL/min   Anion gap 9  CBC  with Differential [[id number]][[id number]] (Abnormal) Collected: 01/11/17 2006  Updated: 01/11/17 2023   Specimen Type: Blood   Specimen Source: Vein    WBC 10.7 (H) K/uL   RBC 5.25 MIL/uL   Hemoglobin 14.7 g/dL   HCT 01.042.5 %   MCV 27.281.0 fL   MCH 28.0 pg   MCHC 34.6 g/dL   RDW 53.613.2 %   Platelets 180 K/uL   Neutrophils Relative % 73 %   Neutro Abs 7.8 (H) K/uL   Lymphocytes Relative 20 %   Lymphs Abs 2.2 K/uL   Monocytes Relative 6 %   Monocytes Absolute 0.6 K/uL   Eosinophils Relative 1 %   Eosinophils Absolute 0.1 K/uL   Basophils Relative 0 %   Basophils Absolute 0.0 K/uL   EKG Vent. rate 86 BPM PR interval 142 ms QRS duration 82 ms QT/QTc 342/409 ms P-R-T axes 35 28 34 Normal sinus rhythm Cannot rule out Anterior infarct , age undetermined Abnormal ECG No previous tracings to compare with  His chest radiograph was negative.   Tony Bryan, M.D.

## 2017-01-11 NOTE — ED Notes (Signed)
ED Provider at bedside. 

## 2017-01-12 ENCOUNTER — Encounter (HOSPITAL_COMMUNITY): Payer: Self-pay | Admitting: Internal Medicine

## 2017-01-12 ENCOUNTER — Telehealth: Payer: Self-pay | Admitting: Cardiology

## 2017-01-12 ENCOUNTER — Observation Stay (HOSPITAL_BASED_OUTPATIENT_CLINIC_OR_DEPARTMENT_OTHER): Payer: 59

## 2017-01-12 DIAGNOSIS — R079 Chest pain, unspecified: Secondary | ICD-10-CM | POA: Diagnosis not present

## 2017-01-12 LAB — TROPONIN I

## 2017-01-12 LAB — HEMOGLOBIN A1C
HEMOGLOBIN A1C: 9.4 % — AB (ref 4.8–5.6)
HEMOGLOBIN A1C: 9.5 % — AB (ref 4.8–5.6)
MEAN PLASMA GLUCOSE: 225.95 mg/dL
Mean Plasma Glucose: 223.08 mg/dL

## 2017-01-12 LAB — LIPID PANEL
CHOL/HDL RATIO: 8.1 ratio
Cholesterol: 186 mg/dL (ref 0–200)
Cholesterol: 202 mg/dL — ABNORMAL HIGH (ref 0–200)
HDL: 22 mg/dL — ABNORMAL LOW (ref >40)
HDL: 25 mg/dL — ABNORMAL LOW (ref >40)
LDL CALC: 109 mg/dL — AB (ref 0–99)
LDL Cholesterol: 127 mg/dL — ABNORMAL HIGH (ref 0–99)
Total CHOL/HDL Ratio: 8.5 RATIO
Triglycerides: 275 mg/dL — ABNORMAL HIGH (ref <150)
Triglycerides: 252 mg/dL — ABNORMAL HIGH (ref <150)
VLDL: 55 mg/dL — AB (ref 0–40)
VLDL: 50 mg/dL — ABNORMAL HIGH (ref 0–40)

## 2017-01-12 LAB — CBC
HCT: 42.3 % (ref 39.0–52.0)
HEMOGLOBIN: 14.2 g/dL (ref 13.0–17.0)
MCH: 27.4 pg (ref 26.0–34.0)
MCHC: 33.6 g/dL (ref 30.0–36.0)
MCV: 81.5 fL (ref 78.0–100.0)
Platelets: 165 10*3/uL (ref 150–400)
RBC: 5.19 MIL/uL (ref 4.22–5.81)
RDW: 13.1 % (ref 11.5–15.5)
WBC: 7.7 10*3/uL (ref 4.0–10.5)

## 2017-01-12 LAB — CBC WITH DIFFERENTIAL/PLATELET
BASOS ABS: 0 10*3/uL (ref 0.0–0.1)
BASOS PCT: 0 %
EOS PCT: 2 %
Eosinophils Absolute: 0.1 10*3/uL (ref 0.0–0.7)
HCT: 42.8 % (ref 39.0–52.0)
Hemoglobin: 14.7 g/dL (ref 13.0–17.0)
LYMPHS PCT: 29 %
Lymphs Abs: 2.3 10*3/uL (ref 0.7–4.0)
MCH: 27.9 pg (ref 26.0–34.0)
MCHC: 34.3 g/dL (ref 30.0–36.0)
MCV: 81.2 fL (ref 78.0–100.0)
MONO ABS: 0.5 10*3/uL (ref 0.1–1.0)
Monocytes Relative: 6 %
Neutro Abs: 5 10*3/uL (ref 1.7–7.7)
Neutrophils Relative %: 63 %
PLATELETS: 157 10*3/uL (ref 150–400)
RBC: 5.27 MIL/uL (ref 4.22–5.81)
RDW: 13.1 % (ref 11.5–15.5)
WBC: 7.9 10*3/uL (ref 4.0–10.5)

## 2017-01-12 LAB — COMPREHENSIVE METABOLIC PANEL
ALT: 43 U/L (ref 17–63)
AST: 34 U/L (ref 15–41)
Albumin: 3.6 g/dL (ref 3.5–5.0)
Alkaline Phosphatase: 76 U/L (ref 38–126)
Anion gap: 10 (ref 5–15)
BILIRUBIN TOTAL: 1.1 mg/dL (ref 0.3–1.2)
BUN: 10 mg/dL (ref 6–20)
CHLORIDE: 103 mmol/L (ref 101–111)
CO2: 23 mmol/L (ref 22–32)
Calcium: 8.8 mg/dL — ABNORMAL LOW (ref 8.9–10.3)
Creatinine, Ser: 0.7 mg/dL (ref 0.61–1.24)
Glucose, Bld: 197 mg/dL — ABNORMAL HIGH (ref 65–99)
Potassium: 3.7 mmol/L (ref 3.5–5.1)
Sodium: 136 mmol/L (ref 135–145)
TOTAL PROTEIN: 7.1 g/dL (ref 6.5–8.1)

## 2017-01-12 LAB — ECHOCARDIOGRAM COMPLETE
HEIGHTINCHES: 70 in
Weight: 4222.25 oz

## 2017-01-12 LAB — GLUCOSE, CAPILLARY
Glucose-Capillary: 191 mg/dL — ABNORMAL HIGH (ref 65–99)
Glucose-Capillary: 183 mg/dL — ABNORMAL HIGH (ref 65–99)

## 2017-01-12 LAB — D-DIMER, QUANTITATIVE (NOT AT ARMC)

## 2017-01-12 LAB — HIV ANTIBODY (ROUTINE TESTING W REFLEX): HIV Screen 4th Generation wRfx: NONREACTIVE

## 2017-01-12 LAB — CREATININE, SERUM
CREATININE: 0.81 mg/dL (ref 0.61–1.24)
GFR calc Af Amer: >60 mL/min (ref >60)

## 2017-01-12 MED ORDER — LIVING WELL WITH DIABETES BOOK
Freq: Once | Status: AC
  Administered 2017-01-12: 14:00:00
  Filled 2017-01-12: qty 1

## 2017-01-12 MED ORDER — ACETAMINOPHEN 325 MG PO TABS
650.0000 mg | ORAL_TABLET | ORAL | Status: DC | PRN
  Administered 2017-01-12: 650 mg via ORAL
  Filled 2017-01-12: qty 2

## 2017-01-12 MED ORDER — INSULIN ASPART 100 UNIT/ML ~~LOC~~ SOLN
0.0000 [IU] | Freq: Three times a day (TID) | SUBCUTANEOUS | Status: DC
  Administered 2017-01-12: 2 [IU] via SUBCUTANEOUS
  Administered 2017-01-13: 3 [IU] via SUBCUTANEOUS

## 2017-01-12 MED ORDER — ENOXAPARIN SODIUM 40 MG/0.4ML ~~LOC~~ SOLN
40.0000 mg | SUBCUTANEOUS | Status: DC
  Administered 2017-01-12 – 2017-01-13 (×2): 40 mg via SUBCUTANEOUS
  Filled 2017-01-12 (×2): qty 0.4

## 2017-01-12 MED ORDER — HYDROCHLOROTHIAZIDE 25 MG PO TABS
25.0000 mg | ORAL_TABLET | Freq: Every day | ORAL | Status: DC
  Administered 2017-01-12 – 2017-01-13 (×2): 25 mg via ORAL
  Filled 2017-01-12 (×2): qty 1

## 2017-01-12 MED ORDER — ASPIRIN 325 MG PO TABS
325.0000 mg | ORAL_TABLET | Freq: Every day | ORAL | Status: DC
  Administered 2017-01-12 – 2017-01-13 (×2): 325 mg via ORAL
  Filled 2017-01-12 (×2): qty 1

## 2017-01-12 MED ORDER — ONDANSETRON HCL 4 MG/2ML IJ SOLN: 4.0000 mg | Freq: Four times a day (QID) | INTRAMUSCULAR | Status: DC | PRN

## 2017-01-12 NOTE — Telephone Encounter (Signed)
NEw MEssage  Morrie Sheldon from [employer] [facility] call stating pt was going to need to reschedule for echo today. Pt ate before appt. Morrie Sheldon states the provider in the hospital wanted to inform pts new cardiologist. Please call back to discuss if needed.

## 2017-01-12 NOTE — Consult Note (Signed)
Cardiology Consultation:   Patient ID: Tony Bryan; [id number]; Oct 15, 1970   Admit date: 01/11/2017 Date of Consult: 01/12/2017  Primary Care Provider: Patient, No Pcp Per Primary Cardiologist: New (Dr. Mayford Knife)   Patient Profile:   Tony Bryan is a 46 y.o. male with a hx of TIA but no prior cardiac history and no cardiac risk factors, who is being seen today for the evaluation of chest pain at the request of Dr. Sunnie Nielsen, Internal Medicine.  History of Present Illness:   Symptom onset 3-4 days ago. Intermitted left sided chest pressure with radiation to left arm, neck and left face. No associated dyspnea. Not worse with exertion. No association with meals. He has had relief with SL NTG and ASA.    In ED, CXR unremarkable. EKG demonstrated NSR. No prior EKGs for comparison. CBC WNL. BMP only notable for hyperglycemia. Renal function and electrolytes WNL. Troponin negative x 2.   He has not had any recurrent symptoms since being admitted after pain resolved in the ED. BP has been elevated in the 150s-160s systolic.   Past Medical History:  Diagnosis Date  . Chronic kidney disease   . Gout    right ankle  . Renal stones   . Stroke [facility]) 2006   TIA    Past Surgical History:  Procedure Laterality Date  . WISDOM TOOTH EXTRACTION       Home Medications:  Prior to Admission medications   Medication Sig Start Date End Date Taking? Authorizing Provider  aspirin 325 MG tablet Take 325 mg by mouth daily.   Yes [provider]  GARLIC PO Take 1 tablet by mouth daily.   Yes [provider]  omega-3 acid ethyl esters (LOVAZA) 1 g capsule Take 1 g by mouth daily.   Yes [provider]    Inpatient Medications: Scheduled Meds: . aspirin  325 mg Oral Daily  . enoxaparin (LOVENOX) injection  40 mg Subcutaneous Q24H   Continuous Infusions:  PRN Meds: acetaminophen, nitroGLYCERIN, ondansetron (ZOFRAN) IV  Allergies:   No Known  Allergies  Social History:   Social History   Social History  . Marital status: Single    Spouse name: N/A  . Number of children: N/A  . Years of education: N/A   Occupational History  . Not on file.   Social History Main Topics  . Smoking status: Never Smoker  . Smokeless tobacco: Never Used  . Alcohol use Yes     Comment: occ  . Drug use: No  . Sexual activity: Not on file   Other Topics Concern  . Not on file   Social History Narrative  . No narrative on file    Family History:    Family History  Problem Relation Age of Onset  . Congestive Heart Failure Mother   . Diabetes Mellitus II Sister      ROS:  Please see the history of present illness.  ROS  All other ROS reviewed and negative.     Physical Exam/Data:   Vitals:   01/12/17 0400 01/12/17 0504 01/12/17 0511 01/12/17 0512  BP: (!) 141/84   (!) 152/56  Pulse: 71   77  Resp: (!) 21   20  Temp:    (!) 97.5 F (36.4 C)  TempSrc:  Oral  Oral  SpO2: 98%   99%  Weight:   263 lb 14.3 oz (119.7 kg)   Height:    (1.778 m)    No intake or  output data in the 24 hours ending 01/12/17 0730 Filed Weights   01/11/17 1934 01/12/17 0511  Weight: 263 lb 14.3 oz (119.7 kg) 263 lb 14.3 oz (119.7 kg)   Body mass index is 37.86 kg/m.  General:  Well nourished, well developed, in no acute distress, moderately obese  HEENT: normal Lymph: no adenopathy Neck: no JVD Endocrine:  No thryomegaly Vascular: No carotid bruits; FA pulses 2+ bilaterally without bruits  Cardiac:  normal S1, S2; RRR; no murmur Lungs:  clear to auscultation bilaterally, no wheezing, rhonchi or rales  Abd: soft, nontender, no hepatomegaly  Ext: no edema Musculoskeletal:  No deformities, BUE and BLE strength normal and equal Skin: warm and dry  Neuro:  CNs 2-12 intact, no focal abnormalities noted Psych:  Normal affect   EKG:  The EKG was personally reviewed and demonstrates:  NSR  Telemetry:  Telemetry was personally reviewed and  demonstrates:  NSR   Relevant CV Studies: None   Laboratory Data:  Chemistry Recent Labs Lab 01/11/17 2006 01/12/17 0559  NA 132* 136  K 4.2 3.7  CL 99* 103  CO2 24 23  GLUCOSE 222* 197*  BUN 9 10  CREATININE 0.77 0.70  CALCIUM 8.9 8.8*  GFRNONAA >60 >60  GFRAA >60 >60  ANIONGAP 9 10     Recent Labs Lab 01/11/17 2006 01/12/17 0559  PROT 7.7 7.1  ALBUMIN 4.0 3.6  AST 41 34  ALT 50 43  ALKPHOS 80 76  BILITOT 0.3 1.1   Hematology Recent Labs Lab 01/11/17 2006 01/12/17 0559  WBC 10.7* 7.9  RBC 5.25 5.27  HGB 14.7 14.7  HCT 42.5 42.8  MCV 81.0 81.2  MCH 28.0 27.9  MCHC 34.6 34.3  RDW 13.2 13.1  PLT 180 157   Cardiac Enzymes Recent Labs Lab 01/11/17 2006 01/11/17 2308 01/12/17 0559  TROPONINI <0.03 <0.03 <0.03  <0.03   No results for input(s): TROPIPOC in the last 168 hours.  BNPNo results for input(s): BNP, PROBNP in the last 168 hours.  DDimer  Recent Labs Lab 01/12/17 0559  DDIMER <0.27    Radiology/Studies:  Dg Chest 2 View  Result Date: 01/11/2017 CLINICAL DATA:  Chest pain and tightness. EXAM: CHEST  2 VIEW COMPARISON:  None. FINDINGS: Normal heart size and mediastinal contours. There is artifact from EKG pads, including over the upper left chest. There is no edema, consolidation, effusion, or pneumothorax. No acute osseous finding. IMPRESSION: Negative chest. Electronically Signed   By: Marnee Spring M.D.   On: 01/11/2017 20:41    Assessment and Plan:   1. Chest Pain: mixed typical and atypical features. Typical left sided chest pressure with radiation to the left arm and left neck/face, however atypical in that symptoms are not triggered nor worsened by exertion. No associated dyspnea. Pain however resolved in ED after SL NTG. EKG shows NSR and troponins negative x 2. Will need stress testing to risks stratify. MD to see. Keep NPO for possible stress test today.    2. HTN: no prior diagnosis, however his BP has been elevated during  admission. Continue to monitor. He may need addition of antihypertensive medication if BP remains persistently elevated.   3. Hyperglycemia: blood glucose was 222 in ED. Recommend checking Hgb A1c to screen for DM.     For questions or updates, please contact [facility] Please consult [email or web address] for contact info under Cardiology/STEMI.   Signed, Robbie Lis, PA-C  01/12/2017 7:30 AM

## 2017-01-12 NOTE — Progress Notes (Signed)
PROGRESS NOTE    Tony Bryan  DGU:[id number] DOB: [birth year] DOA: 01/11/2017 PCP: Patient, No Pcp Per    Brief Narrative: HPI: Tony Bryan is a 46 y.o. male with history of TIA presents to the ER with complaints of chest pain. Patient has been having chest pain off and on for last 3-4 days. Has no relation to exertion or no associated shortness of breath. Patient's chest pain is retrosternal pressure-like and yesterday started radiating to left arm with numbness.   ED Course: In the ER chest pain was relieved after sublingual nitroglycerin and aspirin was given. Patient is presently chest pain-free. Troponin was negative chest x-ray was unremarkable and EKG was showing normal sinus rhythm.   Assessment & Plan:   Principal Problem:   Chest pain   1-Chest pain;  Plan  For stress test/  Troponin negative.  D dimer negative.   2-Diabetes, new diagnosis.  HBA1c at 9/  SSSI.   3-History of TIA; on aspirin.   4-Elevated BP; HTN; start HCTZ   DVT prophylaxis: Lovenox.  Code Status: Full code.  Family Communication: care discussed with patient.  Disposition Plan: home tomorrow if stress test norma;    Consultants:   Cardiology    Procedures: stress test    Antimicrobials: none   Subjective: Report chest pain on and off for last 3 days. He is chest pain free today.    Objective: Vitals:   01/12/17 0504 01/12/17 0511 01/12/17 0512 01/12/17 1306  BP:   (!) 152/56 (!) 154/92  Pulse:   77 72  Resp:   20 20  Temp:   (!) 97.5 F (36.4 C) 99 F (37.2 C)  TempSrc: Oral  Oral Oral  SpO2:   99% 99%  Weight:  119.7 kg (263 lb 14.3 oz)    Height:   (1.778 m)      Intake/Output Summary (Last 24 hours) at 01/12/17 1339 Last data filed at 01/12/17 1000  Gross per 24 hour  Intake              360 ml  Output                0 ml  Net              360 ml   Filed Weights   01/11/17 1934 01/12/17 0511  Weight: 119.7 kg (263 lb 14.3 oz) 119.7 kg (263  lb 14.3 oz)    Examination:  General exam: Appears calm and comfortable  Respiratory system: Clear to auscultation. Respiratory effort normal. Cardiovascular system: S1 & S2 heard, RRR. No JVD, murmurs, rubs, gallops or clicks. No pedal edema. Gastrointestinal system: Abdomen is nondistended, soft and nontender. No organomegaly or masses felt. Normal bowel sounds heard. Central nervous system: Alert and oriented. No focal neurological deficits. Extremities: Symmetric 5 x 5 power. Skin: No rashes, lesions or ulcers Psychiatry: Judgement and insight appear normal. Mood & affect appropriate.     Data Reviewed: I have personally reviewed following labs and imaging studies  CBC:  Recent Labs Lab 01/11/17 2006 01/12/17 0559 01/12/17 0806  WBC 10.7* 7.9 7.7  NEUTROABS 7.8* 5.0  --   HGB 14.7 14.7 14.2  HCT 42.5 42.8 42.3  MCV 81.0 81.2 81.5  PLT 180 157 165   Basic Metabolic Panel:  Recent Labs Lab 01/11/17 2006 01/12/17 0559 01/12/17 0806  NA 132* 136  --   K 4.2 3.7  --   CL 99* 103  --  CO2 24 23  --   GLUCOSE 222* 197*  --   BUN 9 10  --   CREATININE 0.77 0.70 0.81  CALCIUM 8.9 8.8*  --    GFR: Estimated Creatinine Clearance: 150.9 mL/min (by C-G formula based on SCr of 0.81 mg/dL). Liver Function Tests:  Recent Labs Lab 01/11/17 2006 01/12/17 0559  AST 41 34  ALT 50 43  ALKPHOS 80 76  BILITOT 0.3 1.1  PROT 7.7 7.1  ALBUMIN 4.0 3.6   No results for input(s): LIPASE, AMYLASE in the last 168 hours. No results for input(s): AMMONIA in the last 168 hours. Coagulation Profile: No results for input(s): INR, PROTIME in the last 168 hours. Cardiac Enzymes:  Recent Labs Lab 01/11/17 2006 01/11/17 2308 01/12/17 0559 01/12/17 1152  TROPONINI <0.03 <0.03 <0.03  <0.03 <0.03   BNP (last 3 results) No results for input(s): PROBNP in the last 8760 hours. HbA1C:  Recent Labs  01/12/17 0806  HGBA1C 9.4*   CBG: No results for input(s): GLUCAP in the  last 168 hours. Lipid Profile: No results for input(s): CHOL, HDL, LDLCALC, TRIG, CHOLHDL, LDLDIRECT in the last 72 hours. Thyroid Function Tests: No results for input(s): TSH, T4TOTAL, FREET4, T3FREE, THYROIDAB in the last 72 hours. Anemia Panel: No results for input(s): VITAMINB12, FOLATE, FERRITIN, TIBC, IRON, RETICCTPCT in the last 72 hours. Sepsis Labs: No results for input(s): PROCALCITON, LATICACIDVEN in the last 168 hours.  No results found for this or any previous visit (from the past 240 hour(s)).       Radiology Studies: Dg Chest 2 View  Result Date: 01/11/2017 CLINICAL DATA:  Chest pain and tightness. EXAM: CHEST  2 VIEW COMPARISON:  None. FINDINGS: Normal heart size and mediastinal contours. There is artifact from EKG pads, including over the upper left chest. There is no edema, consolidation, effusion, or pneumothorax. No acute osseous finding. IMPRESSION: Negative chest. Electronically Signed   By: Marnee Spring M.D.   On: 01/11/2017 20:41        Scheduled Meds: . aspirin  325 mg Oral Daily  . enoxaparin (LOVENOX) injection  40 mg Subcutaneous Q24H  . insulin aspart  0-9 Units Subcutaneous TID WC   Continuous Infusions:   LOS: 0 days    Time spent: 35 minute,     Regalado, Prentiss Bells, MD [facility] Pager [phone]  If 7PM-7AM, please contact night-coverage [email or web address] Password [email or web address] 01/12/2017, 1:39 PM

## 2017-01-12 NOTE — ED Notes (Signed)
Report given to [employer] at [employer]

## 2017-01-12 NOTE — Progress Notes (Addendum)
Inpatient Diabetes Program Recommendations  AACE/ADA: New Consensus Statement on Inpatient Glycemic Control (2015)  Target Ranges:  Prepandial:   less than 140 mg/dL      Peak postprandial:   less than 180 mg/dL (1-2 hours)      Critically ill patients:  140 - 180 mg/dL   Lab Results  Component Value Date   HGBA1C 9.4 (H) 01/12/2017    Review of Glycemic Control  Diabetes history: No prior hx  Inpatient Diabetes Program Recommendations:  Noted no prior hx DM. -Text page sent to Dr. Tyrell Antonio regarding starting DM education resources. -Glycemic control order set -Novolog correction moderate scale + 0-5 units hs  2:00 pm Met with patient @ bedside.Spoke with pt about new diagnosis. Discussed A1C 9.5 (average BG 226 over the past 2-3 months time) results with them and explained what an A1C is, basic pathophysiology of DM Type 2, basic home care, basic diabetes diet nutrition principles, importance of checking CBGs and maintaining good CBG control to prevent long-term and short-term complications. Reviewed signs and symptoms of hyperglycemia and hypoglycemia and how to treat hypoglycemia at home. Also reviewed blood sugar goals at home.  RNs to provide ongoing basic DM education at bedside with this patient. Have ordered educational booklet and DM videos. Have also placed RD consult for DM diet education for this patient.  Patient states willingness to attend outpatient diabetes education, so will need to add DM2 to diagnosis in EPIC. Will need @ D/C: Blood glucose meter kit ([id number])  Will follow during hospitalization.  Thank you, Nani Gasser. Jesselyn Rask, RN, MSN, CDE  Diabetes Coordinator Inpatient Glycemic Control Team Team Pager [phone] (8am-5pm) 01/12/2017 1:33 PM

## 2017-01-12 NOTE — Plan of Care (Signed)
Problem: Pain Managment: Goal: General experience of comfort will improve Outcome: Progressing  AcutePain managed with Tylenol

## 2017-01-12 NOTE — Care Management Note (Signed)
Case Management Note  Patient Details  Name: FRANCESCO PROVENCAL MRN: [id number] Date of Birth: 16-Jan-1971  Subjective/Objective:  46 y/o m admitted w/chest pain. From home. No pcp. Provided w/pcp listing([facility])-patient will choose on his own, & make own appt. Cardio-stress test today.                   Action/Plan:d/c plan home.   Expected Discharge Date:                  Expected Discharge Plan:  Home/Self Care  In-House Referral:  PCP / [facility]  Discharge planning Services  CM Consult  Post Acute Care Choice:    Choice offered to:     DME Arranged:    DME Agency:     HH Arranged:    HH Agency:     Status of Service:  Completed, signed off  If discussed at [employer] of Stay Meetings, dates discussed:    Additional Comments:  Lanier Clam, RN 01/12/2017, 9:55 AM

## 2017-01-12 NOTE — ED Provider Notes (Signed)
2:00 AM  No tele beds at [facility]. Patient is a 46 year old obese male who presented with chest pain. Currently chest pain-free. Troponin 2 negative. Discussed with Dr. Toniann Fail at [facility] where they do have telemetry beds. He agrees to accept patient for admission.   Stephanos Fan, Layla Maw, DO 01/12/17 [phone]

## 2017-01-12 NOTE — H&P (Signed)
History and Physical    Tony Bryan ZOX:[id number] DOB: [birth year] DOA: 01/11/2017  PCP: Patient, No Pcp Per  Patient coming from: Home.  Chief Complaint: Chest pain.  HPI: Tony Bryan is a 46 y.o. male with history of TIA presents to the ER with complaints of chest pain. Patient has been having chest pain off and on for last 3-4 days. Has no relation to exertion or no associated shortness of breath. Patient's chest pain is retrosternal pressure-like and yesterday started radiating to left arm with numbness.   ED Course: In the ER chest pain was relieved after sublingual nitroglycerin and aspirin was given. Patient is presently chest pain-free. Troponin was negative chest x-ray was unremarkable and EKG was showing normal sinus rhythm.  Review of Systems: As per HPI, rest all negative.   Past Medical History:  Diagnosis Date  . Chronic kidney disease   . Gout    right ankle  . Renal stones   . Stroke [facility]) 2006   TIA    Past Surgical History:  Procedure Laterality Date  . WISDOM TOOTH EXTRACTION       reports that he has never smoked. He has never used smokeless tobacco. He reports that he drinks alcohol. He reports that he does not use drugs.  No Known Allergies  Family History  Problem Relation Age of Onset  . Congestive Heart Failure Mother   . Diabetes Mellitus II Sister     Prior to Admission medications   Medication Sig Start Date End Date Taking? Authorizing Provider  aspirin 325 MG tablet Take 325 mg by mouth daily.   Yes [provider]  GARLIC PO Take 1 tablet by mouth daily.   Yes [provider]  omega-3 acid ethyl esters (LOVAZA) 1 g capsule Take 1 g by mouth daily.   Yes [provider]    Physical Exam: Vitals:   01/12/17 0400 01/12/17 0504 01/12/17 0511 01/12/17 0512  BP: (!) 141/84   (!) 152/56  Pulse: 71   77  Resp: (!) 21   20  Temp:    (!) 97.5 F (36.4 C)  TempSrc:  Oral  Oral  SpO2: 98%   99%    Weight:   119.7 kg (263 lb 14.3 oz)   Height:    (1.778 m)       Constitutional: Moderately built and nourished. Vitals:   01/12/17 0400 01/12/17 0504 01/12/17 0511 01/12/17 0512  BP: (!) 141/84   (!) 152/56  Pulse: 71   77  Resp: (!) 21   20  Temp:    (!) 97.5 F (36.4 C)  TempSrc:  Oral  Oral  SpO2: 98%   99%  Weight:   119.7 kg (263 lb 14.3 oz)   Height:    (1.778 m)    Eyes: Anicteric. No pallor. ENMT: No discharge from the ears eyes nose or mouth. Neck: No mass felt. No JVD appreciated. Respiratory: No rhonchi or crepitations. Cardiovascular: S1-S2 heard no murmurs appreciated. Abdomen: Soft nontender bowel sounds present. No guarding or rigidity. Musculoskeletal: No edema. No joint effusion. Skin: No rash. Skin appears warm. Neurologic: Alert awake oriented to time place and person. Moves all extremities. Psychiatric: Appears normal. Normal affect.   Labs on Admission: I have personally reviewed following labs and imaging studies  CBC:  Recent Labs Lab 01/11/17 2006 01/12/17 0559  WBC 10.7* 7.9  NEUTROABS 7.8* 5.0  HGB 14.7 14.7  HCT 42.5 42.8  MCV 81.0  81.2  PLT 180 157   Basic Metabolic Panel:  Recent Labs Lab 01/11/17 2006  NA 132*  K 4.2  CL 99*  CO2 24  GLUCOSE 222*  BUN 9  CREATININE 0.77  CALCIUM 8.9   GFR: Estimated Creatinine Clearance: 152.8 mL/min (by C-G formula based on SCr of 0.77 mg/dL). Liver Function Tests:  Recent Labs Lab 01/11/17 2006  AST 41  ALT 50  ALKPHOS 80  BILITOT 0.3  PROT 7.7  ALBUMIN 4.0   No results for input(s): LIPASE, AMYLASE in the last 168 hours. No results for input(s): AMMONIA in the last 168 hours. Coagulation Profile: No results for input(s): INR, PROTIME in the last 168 hours. Cardiac Enzymes:  Recent Labs Lab 01/11/17 2006 01/11/17 2308  TROPONINI <0.03 <0.03   BNP (last 3 results) No results for input(s): PROBNP in the last 8760 hours. HbA1C: No results for input(s):  HGBA1C in the last 72 hours. CBG: No results for input(s): GLUCAP in the last 168 hours. Lipid Profile: No results for input(s): CHOL, HDL, LDLCALC, TRIG, CHOLHDL, LDLDIRECT in the last 72 hours. Thyroid Function Tests: No results for input(s): TSH, T4TOTAL, FREET4, T3FREE, THYROIDAB in the last 72 hours. Anemia Panel: No results for input(s): VITAMINB12, FOLATE, FERRITIN, TIBC, IRON, RETICCTPCT in the last 72 hours. Urine analysis:    Component Value Date/Time   COLORURINE YELLOW 02/25/2015 1130   APPEARANCEUR CLOUDY (A) 02/25/2015 1130   LABSPEC 1.015 02/25/2015 1130   PHURINE 5.5 02/25/2015 1130   GLUCOSEU 100 (A) 02/25/2015 1130   HGBUR LARGE (A) 02/25/2015 1130   BILIRUBINUR NEGATIVE 02/25/2015 1130   KETONESUR 15 (A) 02/25/2015 1130   PROTEINUR NEGATIVE 02/25/2015 1130   UROBILINOGEN 0.2 02/25/2015 1130   NITRITE NEGATIVE 02/25/2015 1130   LEUKOCYTESUR NEGATIVE 02/25/2015 1130   Sepsis Labs: (procalcitonin:4,lacticidven:4) )No results found for this or any previous visit (from the past 240 hour(s)).   Radiological Exams on Admission: Dg Chest 2 View  Result Date: 01/11/2017 CLINICAL DATA:  Chest pain and tightness. EXAM: CHEST  2 VIEW COMPARISON:  None. FINDINGS: Normal heart size and mediastinal contours. There is artifact from EKG pads, including over the upper left chest. There is no edema, consolidation, effusion, or pneumothorax. No acute osseous finding. IMPRESSION: Negative chest. Electronically Signed   By: Marnee Spring M.D.   On: 01/11/2017 20:41    EKG: Independently reviewed. Normal sinus rhythm.  Assessment/Plan Principal Problem:   Chest pain    1. Chest pain - has atypical and typical symptoms. Will cycle cardiac markers checked d-dimer check 2-D echo and keep patient on when necessary sublingual nitroglycerin and also on scheduled aspirin. Requested cardiology consult. Patient's blood sugar is elevated concerning for diabetes which could be  in his factor. 2. Hyperglycemia - check hemoglobin A1c. 3. Elevated blood pressure - follow blood pressure trends. 4. History of TIA on aspirin.   DVT prophylaxis: Lovenox. Code Status: Full code.  Family Communication: Discussed with patient.  Disposition Plan: Home.  Consults called: Cardiology.  Admission status: Observation.    Eduard Clos MD [facility] Pager [phone].  If 7PM-7AM, please contact night-coverage [email or web address] Password [facility]  01/12/2017, 6:27 AM

## 2017-01-12 NOTE — Progress Notes (Signed)
  Echocardiogram 2D Echocardiogram has been performed.  Foster, Cleta Heatley 01/12/2017, 12:29 PM 

## 2017-01-12 NOTE — Telephone Encounter (Signed)
Spoke with Morrie Sheldon who is reporting the Celine Ahr was cancelled today because the pt had eaten today.  Advised here in the office instructions are to hold food only 2 hrs prior to test and pts can drink water up until the test of the testing.  Pt is still in the hospital at this time and per Morrie Sheldon he will be scheduled for his myoview tomorrow in the hospital.  She is requesting a diet order because he doesn't have one since being admitted.  Advised to contact PA or MD there in hospital for orders. She states understanding.

## 2017-01-13 ENCOUNTER — Ambulatory Visit (HOSPITAL_BASED_OUTPATIENT_CLINIC_OR_DEPARTMENT_OTHER)
Admit: 2017-01-13 | Discharge: 2017-01-13 | Disposition: A | Discharge status: Home / Self Care | Payer: 59 | Attending: Cardiology | Admitting: Cardiology

## 2017-01-13 DIAGNOSIS — R079 Chest pain, unspecified: Secondary | ICD-10-CM | POA: Diagnosis not present

## 2017-01-13 LAB — NM MYOCAR MULTI W/SPECT W/WALL MOTION / EF
CSEPPHR: 130 {beats}/min
Rest HR: 80 {beats}/min

## 2017-01-13 LAB — GLUCOSE, CAPILLARY
GLUCOSE-CAPILLARY: 220 mg/dL — AB (ref 65–99)
GLUCOSE-CAPILLARY: 211 mg/dL — AB (ref 65–99)

## 2017-01-13 MED ORDER — REGADENOSON 0.4 MG/5ML IV SOLN: 0.4000 mg | Freq: Once | INTRAVENOUS | Status: DC

## 2017-01-13 MED ORDER — METFORMIN HCL 500 MG PO TABS: 500.0000 mg | ORAL_TABLET | Freq: Two times a day (BID) | ORAL | 2 refills | Status: AC

## 2017-01-13 MED ORDER — REGADENOSON 0.4 MG/5ML IV SOLN
INTRAVENOUS | Status: AC
  Filled 2017-01-13: qty 5

## 2017-01-13 MED ORDER — TECHNETIUM TC 99M TETROFOSMIN IV KIT
10.0000 | PACK | Freq: Once | INTRAVENOUS | Status: AC | PRN
  Administered 2017-01-13: 10 via INTRAVENOUS

## 2017-01-13 MED ORDER — HYDROCHLOROTHIAZIDE 25 MG PO TABS: 25.0000 mg | ORAL_TABLET | Freq: Every day | ORAL | 0 refills | Status: AC

## 2017-01-13 MED ORDER — TECHNETIUM TC 99M TETROFOSMIN IV KIT
30.0000 | PACK | Freq: Once | INTRAVENOUS | Status: AC | PRN
  Administered 2017-01-13: 30 via INTRAVENOUS

## 2017-01-13 MED ORDER — BLOOD GLUCOSE METER KIT: PACK | 0 refills | Status: AC

## 2017-01-13 NOTE — Progress Notes (Signed)
Patient is awaiting myoview nuclear stress today. Troponins have been negative, EKG without specific ischemic changes, echo with normal LVEF and no WMAs. Further cardiology recs pending stress results   Dina Rich MD

## 2017-01-13 NOTE — Progress Notes (Addendum)
   Tony Bryan presented for a nuclear stress test today.  No immediate complications.  Stress imaging is pending at this time.  Preliminary EKG findings may be listed in the chart, but the stress test result will not be finalized until perfusion imaging is complete.  Lexiscan chosen due to persistent HTN - further management of BP per IM.  Laurann Montana, PA-C 01/13/2017, 10:24 AM

## 2017-01-13 NOTE — Progress Notes (Signed)
Patient discharged to home with sister at bedside. VSS, tele and IV discontinued. AVS and prescriptions reviewed, patient denies any questions or concerns. Ambulated to sisters vehicle.

## 2017-01-13 NOTE — Discharge Summary (Signed)
Physician Discharge Summary  Tony Bryan:[id number] DOB: 28-May-1970 DOA: 01/11/2017  PCP: Patient, No Pcp Per  Admit date: 01/11/2017 Discharge date: 01/13/2017  Admitted From: Home  Disposition: home   Recommendations for Outpatient Follow-up:  1. Follow up with PCP in 1-2 weeks 2. Please obtain BMP/CBC in one week 3. Needs further risk factors modifications. BP and Blood sugar controlled.    Discharge Condition: stable.  CODE STATUS: Full code.  Diet recommendation: Heart Healthy / Carb Modified   Brief/Interim Summary: HPI: Tony Bryan a 46 y.o.malewith history of TIA presents to the ER with complaints of chest pain. Patient has been having chest pain off and on for last 3-4 days. Has no relation to exertion or no associated shortness of breath. Patient's chest pain is retrosternal pressure-like and yesterday started radiating to left arm with numbness.  ED Course:In the ER chest pain was relieved after sublingual nitroglycerin and aspirin was given. Patient is presently chest pain-free. Troponin was negative chest x-ray was unremarkable and EKG was showing normal sinus rhythm.   Assessment & Plan:   Principal Problem:   Chest pain   1-Chest pain;  Stress test negative.  Troponin negative.  D dimer negative.  Pain could be MSK. If reoccurs consider MRI neck.   2-Diabetes, new diagnosis.  HBA1c at 9/  SSSI.  Discharge on metformin. Increase dose out patient.   3-History of TIA; on aspirin.   4- HTN; started  HCTZ Need  follow up B-met to follow electrolytes.   Discharge Diagnoses:  Principal Problem:   Chest pain Diabetes, new diagnosis.  HTN.    Discharge Instructions  Discharge Instructions    Diet - low sodium heart healthy    Complete by:  As directed    Increase activity slowly    Complete by:  As directed      Allergies as of 01/13/2017   No Known Allergies     Medication List    TAKE these medications   aspirin  325 MG tablet Take 325 mg by mouth daily.   blood glucose meter kit and supplies Dispense based on patient and insurance preference. Use up to four times daily as directed. (FOR ICD-9 250.00, 250.01).   GARLIC PO Take 1 tablet by mouth daily.   hydrochlorothiazide 25 MG tablet Commonly known as:  HYDRODIURIL Take 1 tablet (25 mg total) by mouth daily.   metFORMIN 500 MG tablet Commonly known as:  GLUCOPHAGE Take 1 tablet (500 mg total) by mouth 2 (two) times daily with a meal.   omega-3 acid ethyl esters 1 g capsule Commonly known as:  LOVAZA Take 1 g by mouth daily.            Discharge Care Instructions        Start     Ordered   01/14/17 0000  hydrochlorothiazide (HYDRODIURIL) 25 MG tablet  Daily     01/13/17 1601   01/13/17 0000  Increase activity slowly     01/13/17 1601   01/13/17 0000  Diet - low sodium heart healthy     01/13/17 1601   01/13/17 0000  metFORMIN (GLUCOPHAGE) 500 MG tablet  2 times daily with meals     01/13/17 1601   01/13/17 0000  blood glucose meter kit and supplies    Question Answer Comment  Number of strips 30   Number of lancets 30      01/13/17 1604      No Known Allergies  Consultations:  Cardiology    Procedures/Studies: Dg Chest 2 View  Result Date: 01/11/2017 CLINICAL DATA:  Chest pain and tightness. EXAM: CHEST  2 VIEW COMPARISON:  None. FINDINGS: Normal heart size and mediastinal contours. There is artifact from EKG pads, including over the upper left chest. There is no edema, consolidation, effusion, or pneumothorax. No acute osseous finding. IMPRESSION: Negative chest. Electronically Signed   By: Monte Fantasia M.D.   On: 01/11/2017 20:41   Nm Myocar Multi W/spect W/wall Motion / Ef  Result Date: 01/13/2017  The study is normal.  This is a low risk study.  The left ventricular ejection fraction is mildly decreased (45-54%).  Nuclear stress EF: 52%.  Low risk stress nuclear study with normal perfusion and  borderline low left ventricular systolic function.      Subjective: He is feeling well, no chest pain   Discharge Exam: Vitals:   01/13/17 1130 01/13/17 1132  BP: (!) 147/110 137/85  Pulse: 85   Resp:    Temp: 98.5 F (36.9 C)   SpO2: 98% 100%   Vitals:   01/13/17 0247 01/13/17 0619 01/13/17 1130 01/13/17 1132  BP: (!) 142/89 (!) 151/93 (!) 147/110 137/85  Pulse: 71 88 85   Resp: 18 16    Temp: 97.9 F (36.6 C) 98.2 F (36.8 C) 98.5 F (36.9 C)   TempSrc: Oral Oral Oral   SpO2: 94% 99% 98% 100%  Weight:      Height:        General: Pt is alert, awake, not in acute distress Cardiovascular: RRR, S1/S2 +, no rubs, no gallops Respiratory: CTA bilaterally, no wheezing, no rhonchi Abdominal: Soft, NT, ND, bowel sounds + Extremities: no edema, no cyanosis    The results of significant diagnostics from this hospitalization (including imaging, microbiology, ancillary and laboratory) are listed below for reference.     Microbiology: No results found for this or any previous visit (from the past 240 hour(s)).   Labs: BNP (last 3 results) No results for input(s): BNP in the last 8760 hours. Basic Metabolic Panel:  Recent Labs Lab 01/11/17 2006 01/12/17 0559 01/12/17 0806  NA 132* 136  --   K 4.2 3.7  --   CL 99* 103  --   CO2 24 23  --   GLUCOSE 222* 197*  --   BUN 9 10  --   CREATININE 0.77 0.70 0.81  CALCIUM 8.9 8.8*  --    Liver Function Tests:  Recent Labs Lab 01/11/17 2006 01/12/17 0559  AST 41 34  ALT 50 43  ALKPHOS 80 76  BILITOT 0.3 1.1  PROT 7.7 7.1  ALBUMIN 4.0 3.6   No results for input(s): LIPASE, AMYLASE in the last 168 hours. No results for input(s): AMMONIA in the last 168 hours. CBC:  Recent Labs Lab 01/11/17 2006 01/12/17 0559 01/12/17 0806  WBC 10.7* 7.9 7.7  NEUTROABS 7.8* 5.0  --   HGB 14.7 14.7 14.2  HCT 42.5 42.8 42.3  MCV 81.0 81.2 81.5  PLT 180 157 165   Cardiac Enzymes:  Recent Labs Lab 01/11/17 2006  01/11/17 2308 01/12/17 0559 01/12/17 1152 01/12/17 1721  TROPONINI <0.03 <0.03 <0.03  <0.03 <0.03 <0.03   BNP: Invalid input(s): POCBNP CBG:  Recent Labs Lab 01/12/17 1654 01/12/17 2204 01/13/17 0721 01/13/17 1134  GLUCAP 191* 183* 220* 211*   D-Dimer  Recent Labs  01/12/17 0559  DDIMER <0.27   Hgb A1c  Recent Labs  01/12/17 0806 01/12/17 1152  HGBA1C 9.4* 9.5*   Lipid Profile  Recent Labs  01/12/17 0806 01/12/17 1152  CHOL 186 202*  HDL 22* 25*  LDLCALC 109* 127*  TRIG 275* 252*  CHOLHDL 8.5 8.1   Thyroid function studies No results for input(s): TSH, T4TOTAL, T3FREE, THYROIDAB in the last 72 hours.  Invalid input(s): FREET3 Anemia work up No results for input(s): VITAMINB12, FOLATE, FERRITIN, TIBC, IRON, RETICCTPCT in the last 72 hours. Urinalysis    Component Value Date/Time   COLORURINE YELLOW 02/25/2015 1130   APPEARANCEUR CLOUDY (A) 02/25/2015 1130   LABSPEC 1.015 02/25/2015 1130   PHURINE 5.5 02/25/2015 1130   GLUCOSEU 100 (A) 02/25/2015 1130   HGBUR LARGE (A) 02/25/2015 1130   BILIRUBINUR NEGATIVE 02/25/2015 1130   KETONESUR 15 (A) 02/25/2015 1130   PROTEINUR NEGATIVE 02/25/2015 1130   UROBILINOGEN 0.2 02/25/2015 1130   NITRITE NEGATIVE 02/25/2015 1130   LEUKOCYTESUR NEGATIVE 02/25/2015 1130   Sepsis Labs Invalid input(s): PROCALCITONIN,  WBC,  LACTICIDVEN Microbiology No results found for this or any previous visit (from the past 240 hour(s)).   Time coordinating discharge: Over 30 minutes  SIGNED:   Elmarie Shiley, MD  [facility] 01/13/2017, 4:04 PM Pager   If 7PM-7AM, please contact night-coverage [email or web address] Password [email or web address]

## 2017-01-13 NOTE — Progress Notes (Signed)
Pt returned from procedure, VSS, tele continued.

## 2017-01-13 NOTE — Progress Notes (Signed)
Negative stress test. Cardiac workup has shown no objective evidence his symptoms are cardiac in etiology. We will sign off inpatient care.BP management per primary team, agree with him starting diuretic   Dina Rich MD

## 2018-07-02 ENCOUNTER — Emergency Department (HOSPITAL_BASED_OUTPATIENT_CLINIC_OR_DEPARTMENT_OTHER)
Admission: EM | Admit: 2018-07-02 | Discharge: 2018-07-02 | Disposition: A | Discharge status: Home / Self Care | Payer: 59 | Attending: Emergency Medicine | Admitting: Emergency Medicine

## 2018-07-02 ENCOUNTER — Other Ambulatory Visit: Payer: Self-pay

## 2018-07-02 ENCOUNTER — Emergency Department (HOSPITAL_BASED_OUTPATIENT_CLINIC_OR_DEPARTMENT_OTHER): Payer: 59

## 2018-07-02 ENCOUNTER — Encounter (HOSPITAL_BASED_OUTPATIENT_CLINIC_OR_DEPARTMENT_OTHER): Payer: Self-pay

## 2018-07-02 DIAGNOSIS — N189 Chronic kidney disease, unspecified: Secondary | ICD-10-CM | POA: Insufficient documentation

## 2018-07-02 DIAGNOSIS — Z7982 Long term (current) use of aspirin: Secondary | ICD-10-CM | POA: Diagnosis not present

## 2018-07-02 DIAGNOSIS — R0789 Other chest pain: Secondary | ICD-10-CM | POA: Diagnosis not present

## 2018-07-02 DIAGNOSIS — E1122 Type 2 diabetes mellitus with diabetic chronic kidney disease: Secondary | ICD-10-CM | POA: Insufficient documentation

## 2018-07-02 DIAGNOSIS — R079 Chest pain, unspecified: Secondary | ICD-10-CM | POA: Diagnosis present

## 2018-07-02 DIAGNOSIS — Z79899 Other long term (current) drug therapy: Secondary | ICD-10-CM | POA: Insufficient documentation

## 2018-07-02 DIAGNOSIS — I129 Hypertensive chronic kidney disease with stage 1 through stage 4 chronic kidney disease, or unspecified chronic kidney disease: Secondary | ICD-10-CM | POA: Insufficient documentation

## 2018-07-02 HISTORY — DX: Type 2 diabetes mellitus without complications: E11.9

## 2018-07-02 HISTORY — DX: Pure hypercholesterolemia, unspecified: E78.00

## 2018-07-02 HISTORY — DX: Essential (primary) hypertension: I10

## 2018-07-02 HISTORY — DX: Disease of gallbladder, unspecified: K82.9

## 2018-07-02 LAB — CBC
HCT: 46.6 % (ref 39.0–52.0)
Hemoglobin: 14.6 g/dL (ref 13.0–17.0)
MCH: 26.4 pg (ref 26.0–34.0)
MCHC: 31.3 g/dL (ref 30.0–36.0)
MCV: 84.4 fL (ref 80.0–100.0)
NRBC: 0 % (ref 0.0–0.2)
Platelets: 230 10*3/uL (ref 150–400)
RBC: 5.52 MIL/uL (ref 4.22–5.81)
RDW: 12.9 % (ref 11.5–15.5)
WBC: 11 10*3/uL — ABNORMAL HIGH (ref 4.0–10.5)

## 2018-07-02 LAB — BASIC METABOLIC PANEL
Anion gap: 7 (ref 5–15)
BUN: 25 mg/dL — ABNORMAL HIGH (ref 6–20)
CO2: 24 mmol/L (ref 22–32)
Calcium: 9 mg/dL (ref 8.9–10.3)
Chloride: 102 mmol/L (ref 98–111)
Creatinine, Ser: 1.16 mg/dL (ref 0.61–1.24)
GFR calc non Af Amer: >60 mL/min (ref >60)
Glucose, Bld: 105 mg/dL — ABNORMAL HIGH (ref 70–99)
Potassium: 3.9 mmol/L (ref 3.5–5.1)
Sodium: 133 mmol/L — ABNORMAL LOW (ref 135–145)

## 2018-07-02 LAB — TROPONIN I: Troponin I: <0.03 ng/mL (ref <0.03)

## 2018-07-02 MED ORDER — SODIUM CHLORIDE 0.9% FLUSH
3.0000 mL | Freq: Once | INTRAVENOUS | Status: DC
  Filled 2018-07-02: qty 3

## 2018-07-02 NOTE — ED Provider Notes (Signed)
[facility] EMERGENCY DEPARTMENT Provider Note   CSN: [id number] Arrival date & time: 07/02/18  1947    History   Chief Complaint Chief Complaint  Patient presents with  . Chest Pain    HPI Tony Bryan is a 49 y.o. male.     Patient is a 48 year old male with past medical history of diabetes, hypertension, and hyperlipidemia.  He presents today for evaluation of chest discomfort and numbness.  He reports he has been having discomfort off and on in his chest for the past 2 months.  This comes and goes and is unrelated to eating, exertion.  He denies any shortness of breath, nausea, or diaphoresis.  He describes a pressure to the front of his chest that may last minutes or hours, then resolved spontaneously.  He also describes a "numb" sensation to his chest and face that occurs intermittently.  This has been ongoing for the past 4 months.  This occurs separately from the chest discomfort that he is describing.  He states this evening he nearly had a "panic attack" after speaking with a colleague who describes similar symptoms and required a coronary artery stent.  Patient has been hospitalized in the past at [facility], approximately 2 years ago.  At that time, he underwent a stress echo which was unremarkable.  The history is provided by the patient.  Chest Pain  Pain location:  Substernal area Pain quality: pressure (2)   Pain radiates to:  Does not radiate Pain severity:  Moderate Timing:  Intermittent Progression:  Worsening Chronicity:  New   Past Medical History:  Diagnosis Date  . Chronic kidney disease   . Diabetes mellitus without complication ([place])   . Gallbladder disorder   . Gout    right ankle  . High cholesterol   . Hypertension   . Renal stones   . Stroke [facility]) 2006   TIA    Patient Active Problem List   Diagnosis Date Noted  . Chest pain 01/11/2017    Past Surgical History:  Procedure Laterality Date  . WISDOM TOOTH EXTRACTION           Home Medications    Prior to Admission medications   Medication Sig Start Date End Date Taking? Authorizing Provider  aspirin 325 MG tablet Take 325 mg by mouth daily.    [provider]  blood glucose meter kit and supplies Dispense based on patient and insurance preference. Use up to four times daily as directed. (FOR ICD-9 250.00, 250.01). 01/13/17   Regalado, Belkys A, MD  GARLIC PO Take 1 tablet by mouth daily.    [provider]  hydrochlorothiazide (HYDRODIURIL) 25 MG tablet Take 1 tablet (25 mg total) by mouth daily. 01/14/17   Regalado, Belkys A, MD  metFORMIN (GLUCOPHAGE) 500 MG tablet Take 1 tablet (500 mg total) by mouth 2 (two) times daily with a meal. 01/13/17 01/13/18  Regalado, Belkys A, MD  omega-3 acid ethyl esters (LOVAZA) 1 g capsule Take 1 g by mouth daily.    [provider]    Family History Family History  Problem Relation Age of Onset  . Congestive Heart Failure Mother   . Diabetes Mellitus II Sister     Social History Social History   Tobacco Use  . Smoking status: Never Smoker  . Smokeless tobacco: Never Used  Substance Use Topics  . Alcohol use: Not Currently  . Drug use: No     Allergies   Patient has no  known allergies.   Review of Systems Review of Systems  Cardiovascular: Positive for chest pain.  All other systems reviewed and are negative.    Physical Exam Updated Vital Signs BP (!) 144/81 (BP Location: Left Arm)   Pulse 88   Temp 98.4 F (36.9 C) (Oral)   Resp 16   Ht _0  (1.753 m)   Wt 110.2 kg   SpO2 100%   BMI 35.88 kg/m   Physical Exam Vitals signs and nursing note reviewed.  Constitutional:      General: He is not in acute distress.    Appearance: He is well-developed. He is not diaphoretic.  HENT:     Head: Normocephalic and atraumatic.  Neck:     Musculoskeletal: Normal range of motion and neck supple.  Cardiovascular:     Rate and Rhythm: Normal rate and regular rhythm.      Heart sounds: No murmur. No friction rub.  Pulmonary:     Effort: Pulmonary effort is normal. No respiratory distress.     Breath sounds: Normal breath sounds. No wheezing or rales.  Abdominal:     General: Bowel sounds are normal. There is no distension.     Palpations: Abdomen is soft.     Tenderness: There is no abdominal tenderness.  Musculoskeletal: Normal range of motion.     Right lower leg: He exhibits no tenderness. No edema.     Left lower leg: He exhibits no tenderness. No edema.     Comments: There is no calf tenderness or swelling.  Homans sign is absent bilaterally.  Skin:    General: Skin is warm and dry.  Neurological:     Mental Status: He is alert and oriented to person, place, and time.     Coordination: Coordination normal.      ED Treatments / Results  Labs (all labs ordered are listed, but only abnormal results are displayed) Labs Reviewed  BASIC METABOLIC PANEL  CBC  TROPONIN I    EKG EKG Interpretation  Date/Time:  Tuesday July 02 2018 19:54:58 EST Ventricular Rate:  88 PR Interval:  144 QRS Duration: 84 QT Interval:  344 QTC Calculation: 416 R Axis:   42 Text Interpretation:  Normal sinus rhythm Normal ECG Confirmed by Veryl Speak [phone]) on 07/02/2018 8:21:03 PM   Radiology No results found.  Procedures Procedures (including critical care time)  Medications Ordered in ED Medications  sodium chloride flush (NS) 0.9 % injection 3 mL (has no administration in time range)     Initial Impression / Assessment and Plan / ED Course  I have reviewed the triage vital signs and the nursing notes.  Pertinent labs & imaging results that were available during my care of the patient were reviewed by me and considered in my medical decision making (see chart for details).  Patient presents here with complaints of chest discomfort that has been ongoing for the past 2 months.  It has been occurring intermittently and not related to exertion or  physical activity.  He also describes a 6-monthhistory of intermittent numbness in his chest.  His symptoms are atypical for cardiac pain, his EKG is unchanged, and troponin is negative.  I doubt a cardiac etiology, but the patient does have several risk factors that I feel a cardiology follow-up would be appropriate.  [facility] advised to follow-up there to discuss whether or not further testing should be performed.  He  is to return to the ER in the meantime if symptoms worsen or change.  Final Clinical Impressions(s) / ED Diagnoses   Final diagnoses:  None    ED Discharge Orders    None       Veryl Speak, MD 07/02/18 2218

## 2018-07-02 NOTE — Discharge Instructions (Addendum)
Follow-up with cardiology to discuss the possibility of additional stress testing.  The contact number for the [facility] cardiology clinic has been provided in this discharge summary for you to call and make these arrangements.  Return to the emergency department in the meantime if your symptoms worsen or change.

## 2018-07-02 NOTE — ED Triage Notes (Signed)
C/o chest pressure x 2 months-also c/o scattered numbness x 4 months-NAD-steady gait

## 2018-07-02 NOTE — ED Notes (Signed)
ED Provider at bedside. 

## 2018-07-19 ENCOUNTER — Telehealth: Payer: Self-pay | Admitting: Cardiology

## 2018-07-19 NOTE — Telephone Encounter (Signed)
LVM to call office to r/s appt with Dr. [place], change in provider's schedule.

## 2018-07-23 ENCOUNTER — Ambulatory Visit: Payer: 59 | Admitting: Cardiology

## 2018-08-19 ENCOUNTER — Telehealth: Payer: Self-pay

## 2018-08-19 NOTE — Telephone Encounter (Signed)
LEFT MESSAGE TO CALL BACK

## 2018-08-22 NOTE — Progress Notes (Deleted)
{Choose 1 Note Type (Telehealth Visit or Telephone Visit):[phone]}   Evaluation Performed:  Follow-up visit  Date:  08/22/2018   ID:  Tony Bryan, DOB 1971-01-25, MRN [id number]  {Patient Location:[id number]::"Home"} {Provider [phone]}  PCP:  Gillian Scarce, MD  Cardiologist:  No primary care provider on file. *** Electrophysiologist:  None   Chief Complaint:  ***  History of Present Illness:    Tony Bryan is a 48 y.o. male with ***  The patient was in the ED in late March with chest pain.  I reviewed these records for this visit.    He did have an echo in 2018 with an EF of 60 - 65% with no significant valve abnormalities.  Perfusion study demonstrated no ischemia with an EF of 52%.    ***   The patient {does/does not:200015} have symptoms concerning for COVID-19 infection (fever, chills, cough, or new shortness of breath).    Past Medical History:  Diagnosis Date  . Chronic kidney disease   . Diabetes mellitus without complication ([facility])   . Gallbladder disorder   . Gout    right ankle  . High cholesterol   . Hypertension   . Renal stones   . Stroke [facility]) 2006   TIA   Past Surgical History:  Procedure Laterality Date  . WISDOM TOOTH EXTRACTION       No outpatient medications have been marked as taking for the 08/23/18 encounter (Appointment) with Rollene Rotunda, MD.     Allergies:   Patient has no known allergies.   Social History   Tobacco Use  . Smoking status: Never Smoker  . Smokeless tobacco: Never Used  Substance Use Topics  . Alcohol use: Not Currently  . Drug use: No     Family Hx: The patient's family history includes Congestive Heart Failure in his mother; Diabetes Mellitus II in his sister.  ROS:   Please see the history of present illness.    *** All other systems reviewed and are negative.   Prior CV studies:   The following studies were reviewed today:  ***  Labs/Other Tests and Data Reviewed:     EKG:  {EKG/Telemetry Strips Reviewed:([phone]}  Recent Labs: 07/02/2018: BUN 25; Creatinine, Ser 1.16; Hemoglobin 14.6; Platelets 230; Potassium 3.9; Sodium 133   Recent Lipid Panel Lab Results  Component Value Date/Time   CHOL 202 (H) 01/12/2017 11:52 AM   TRIG 252 (H) 01/12/2017 11:52 AM   HDL 25 (L) 01/12/2017 11:52 AM   CHOLHDL 8.1 01/12/2017 11:52 AM   LDLCALC 127 (H) 01/12/2017 11:52 AM    Wt Readings from Last 3 Encounters:  07/02/18 243 lb (110.2 kg)  01/12/17 263 lb 14.3 oz (119.7 kg)  03/15/15 270 lb 12.8 oz (122.8 kg)     Objective:    Vital Signs:  There were no vitals taken for this visit.   {[facility] Virtual Exam (Optional):[phone]::"VITAL SIGNS:  reviewed"}  ASSESSMENT & PLAN:    CHEST PAIN:  ***    COVID-19 Education: The signs and symptoms of COVID-19 were discussed with the patient and how to seek care for testing (follow up with PCP or arrange E-visit).  ***The importance of social distancing was discussed today.  Time:   Today, I have spent *** minutes with the patient with telehealth technology discussing the above problems.     Medication Adjustments/Labs and Tests Ordered: Current medicines are reviewed at length with the patient today.  Concerns regarding medicines are outlined above.   Tests  Ordered: No orders of the defined types were placed in this encounter.   Medication Changes: No orders of the defined types were placed in this encounter.   Disposition:  Follow up {follow up:15908}  Signed, Rollene RotundaJames Telma Pyeatt, MD  08/22/2018 4:27 PM    [facility]

## 2018-08-23 ENCOUNTER — Ambulatory Visit: Payer: 59 | Admitting: Cardiology

## 2020-09-12 IMAGING — CR DG CHEST 2V
2 series · 2 of 2 positions shown · non-contrast
Comparison: 01/11/2017

CLINICAL DATA: Chest pain

EXAM:
CHEST - 2 VIEW

[w chest pa]
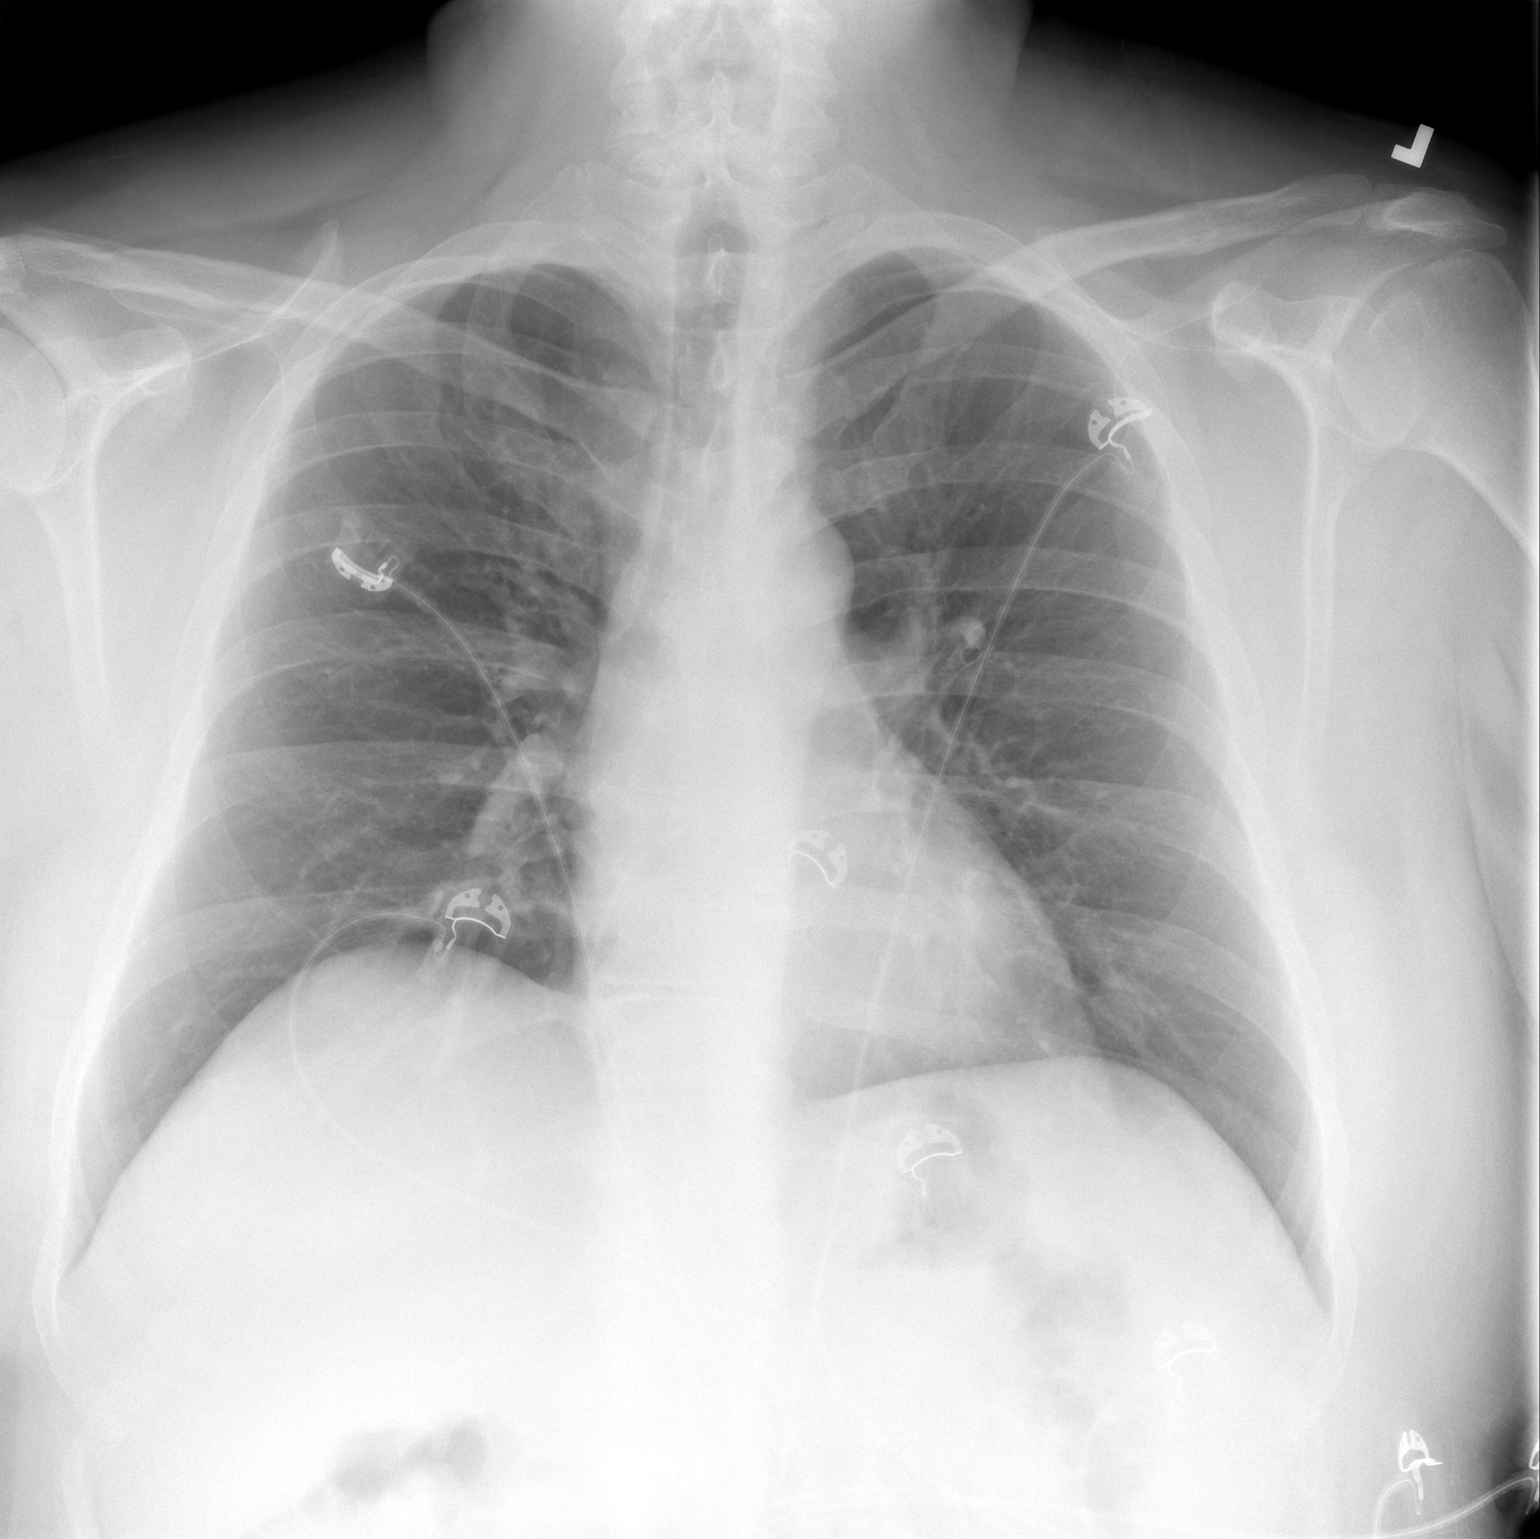

[w chest lat]
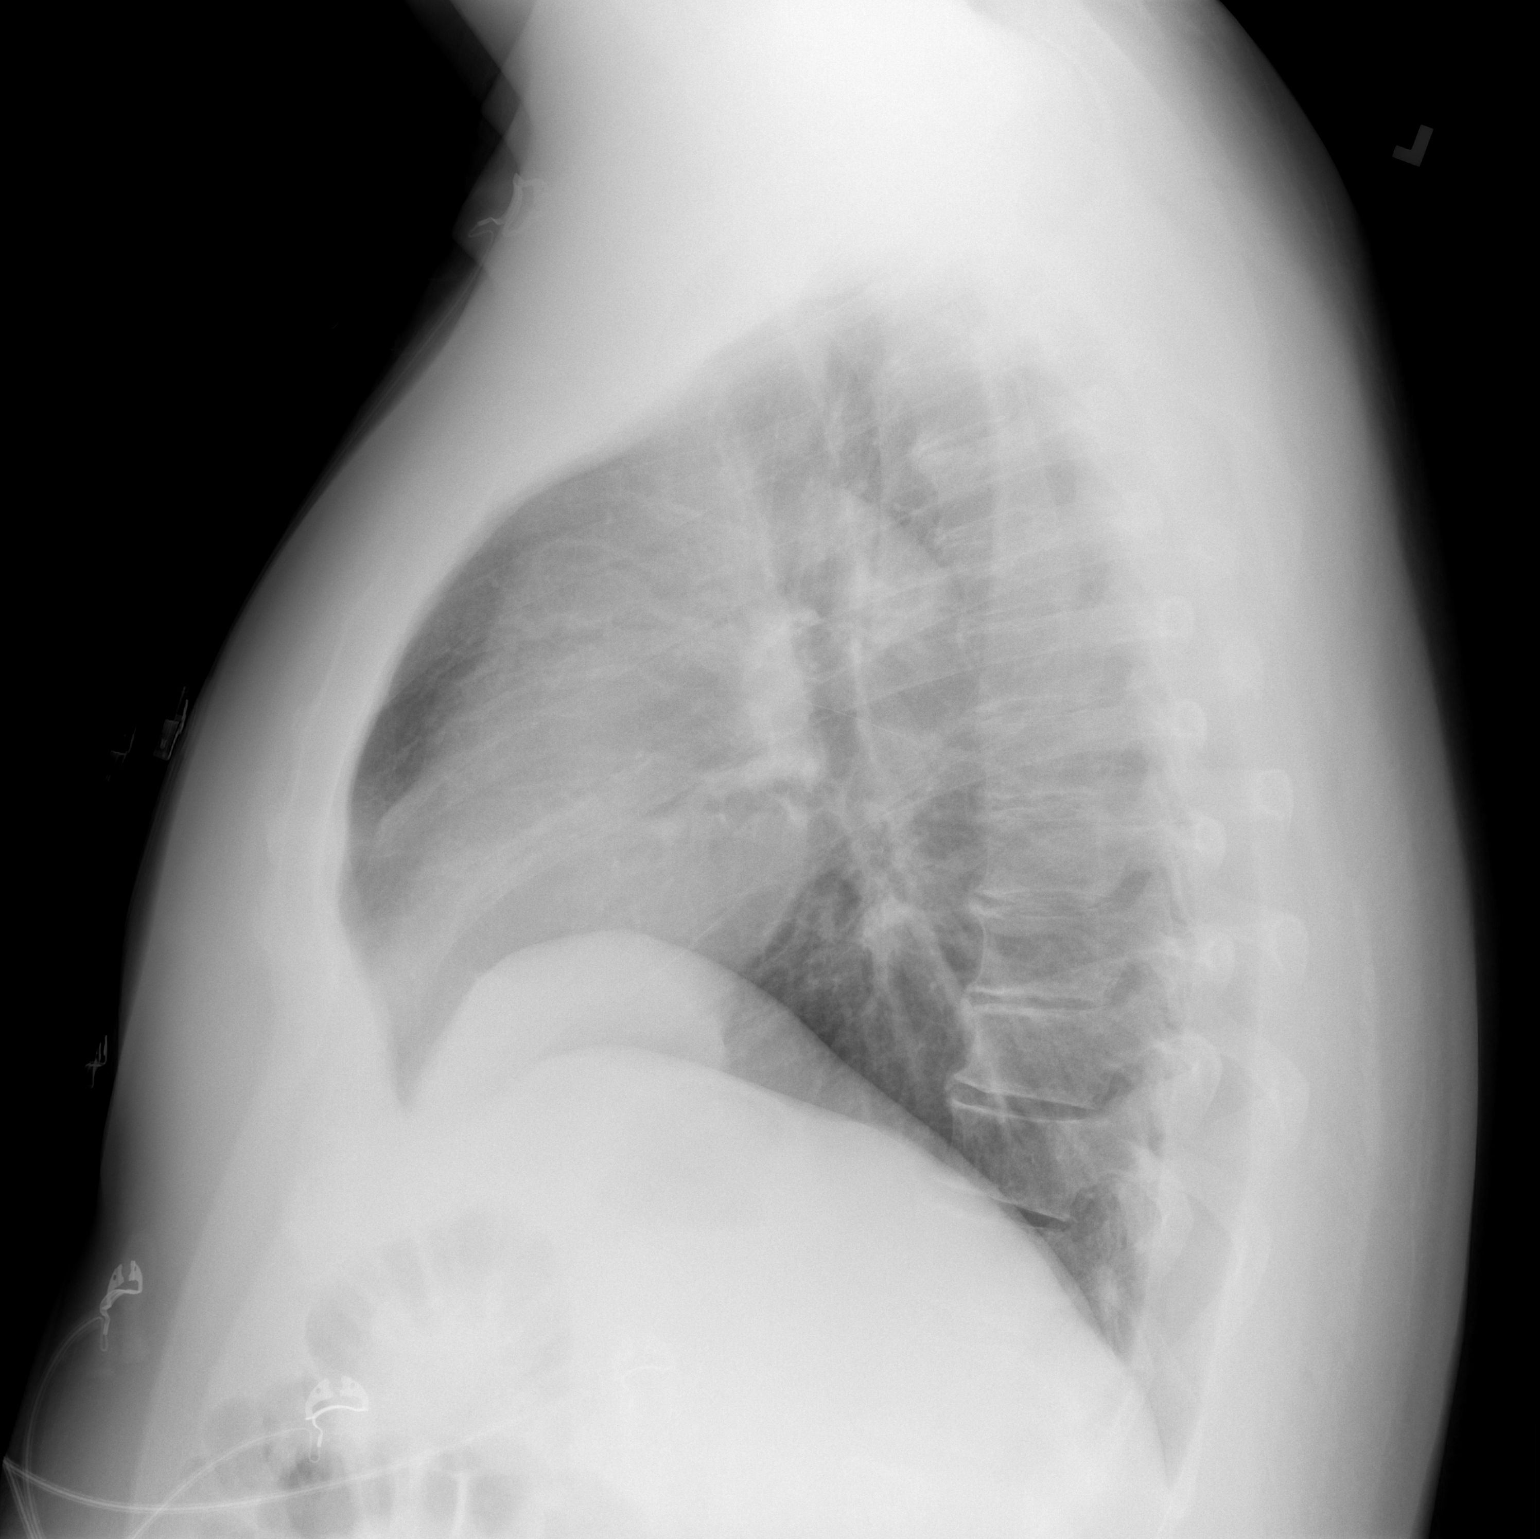

[2 of 2 positions shown; findings below may reference images not displayed]

FINDINGS: The heart size and mediastinal contours are within normal limits.
Both lungs are clear. Mild degenerative changes of the spine.
IMPRESSION: No active cardiopulmonary disease.

## 2022-10-08 ENCOUNTER — Encounter (HOSPITAL_BASED_OUTPATIENT_CLINIC_OR_DEPARTMENT_OTHER): Payer: Self-pay | Admitting: Emergency Medicine

## 2022-10-08 ENCOUNTER — Emergency Department (HOSPITAL_BASED_OUTPATIENT_CLINIC_OR_DEPARTMENT_OTHER): Payer: Self-pay

## 2022-10-08 ENCOUNTER — Emergency Department (HOSPITAL_BASED_OUTPATIENT_CLINIC_OR_DEPARTMENT_OTHER)
Admission: EM | Admit: 2022-10-08 | Discharge: 2022-10-08 | Disposition: A | Discharge status: Home / Self Care | Payer: Self-pay | Attending: Emergency Medicine | Admitting: Emergency Medicine

## 2022-10-08 ENCOUNTER — Other Ambulatory Visit: Payer: Self-pay

## 2022-10-08 DIAGNOSIS — Z7982 Long term (current) use of aspirin: Secondary | ICD-10-CM | POA: Insufficient documentation

## 2022-10-08 DIAGNOSIS — T189XXA Foreign body of alimentary tract, part unspecified, initial encounter: Secondary | ICD-10-CM | POA: Insufficient documentation

## 2022-10-08 DIAGNOSIS — W44F3XA Food entering into or through a natural orifice, initial encounter: Secondary | ICD-10-CM | POA: Insufficient documentation

## 2022-10-08 DIAGNOSIS — K8020 Calculus of gallbladder without cholecystitis without obstruction: Secondary | ICD-10-CM | POA: Insufficient documentation

## 2022-10-08 LAB — BASIC METABOLIC PANEL
Anion gap: 10 (ref 5–15)
BUN: 15 mg/dL (ref 6–20)
CO2: 21 mmol/L — ABNORMAL LOW (ref 22–32)
Calcium: 8.9 mg/dL (ref 8.9–10.3)
Chloride: 107 mmol/L (ref 98–111)
Creatinine, Ser: 0.96 mg/dL (ref 0.61–1.24)
GFR, Estimated: >60 mL/min (ref >60)
Glucose, Bld: 104 mg/dL — ABNORMAL HIGH (ref 70–99)
Potassium: 4 mmol/L (ref 3.5–5.1)
Sodium: 138 mmol/L (ref 135–145)

## 2022-10-08 LAB — CBC
HCT: 41.1 % (ref 39.0–52.0)
Hemoglobin: 13.4 g/dL (ref 13.0–17.0)
MCH: 27 pg (ref 26.0–34.0)
MCHC: 32.6 g/dL (ref 30.0–36.0)
MCV: 82.9 fL (ref 80.0–100.0)
Platelets: 187 10*3/uL (ref 150–400)
RBC: 4.96 MIL/uL (ref 4.22–5.81)
RDW: 13 % (ref 11.5–15.5)
WBC: 7.4 10*3/uL (ref 4.0–10.5)
nRBC: 0 % (ref 0.0–0.2)

## 2022-10-08 NOTE — Discharge Instructions (Signed)
Get help right away if: You have a fever. You have pain in your chest or your abdomen. You cough up blood. You have blood in your stool (feces). You have blood in your vomit after treatment. These symptoms may be an emergency. Get help right away. Call 911. Do not wait to see if the symptoms will go away. Do not drive yourself to the hospital.

## 2022-10-08 NOTE — ED Triage Notes (Signed)
Pt drank his water and swallowed what he thought was ice; he thinks it was a water bead (has examples here), which expand when they are wet; denies pain; is concerned it could cause a blockage

## 2022-10-08 NOTE — ED Provider Notes (Signed)
[place] EMERGENCY DEPARTMENT AT [facility] Provider Note   CSN: [id number] Arrival date & time: 10/08/22  1235     History  Chief Complaint  Patient presents with   Swallowed Foreign Body    Tony Bryan is a 52 y.o. male.  Who presents emergency department after ingestion of an or bees water bead.  Patient was at work when he took a drink out of his water bottle.  He had been in the back and he swallowed a foreign substance.  He looked in the bottle and realized that someone had put water beads in his bottle.  He states that he knows that they are nontoxic but was concerned about the potential for obstruction.  He did have 2 episodes of vomiting up approximately 10 minutes after swallowing the water bead.  He states he does not think that they get a whole lot bigger than a marble but he has some soaking in water to see their potential full growth.  He does not have any symptoms of abdominal pain or vomiting at this time.   Swallowed Foreign Body       Home Medications Prior to Admission medications   Medication Sig Start Date End Date Taking? Authorizing Provider  aspirin 325 MG tablet Take 325 mg by mouth daily.    [provider]  blood glucose meter kit and supplies Dispense based on patient and insurance preference. Use up to four times daily as directed. (FOR ICD-9 250.00, 250.01). 01/13/17   Regalado, Belkys A, MD  GARLIC PO Take 1 tablet by mouth daily.    [provider]  hydrochlorothiazide (HYDRODIURIL) 25 MG tablet Take 1 tablet (25 mg total) by mouth daily. 01/14/17   Regalado, Belkys A, MD  metFORMIN (GLUCOPHAGE) 500 MG tablet Take 1 tablet (500 mg total) by mouth 2 (two) times daily with a meal. 01/13/17 01/13/18  Regalado, Belkys A, MD  omega-3 acid ethyl esters (LOVAZA) 1 g capsule Take 1 g by mouth daily.    [provider]      Allergies    Patient has no known allergies.    Review of Systems   Review of  Systems  Physical Exam Updated Vital Signs BP 133/86   Pulse 96   Temp (!) 97.2 F (36.2 C) (Oral)   Resp 18   Ht 5\' 9"  (1.753 m)   Wt 112.9 kg   SpO2 96%   BMI 36.77 kg/m  Physical Exam Vitals and nursing note reviewed.  Constitutional:      General: He is not in acute distress.    Appearance: He is well-developed. He is not diaphoretic.  HENT:     Head: Normocephalic and atraumatic.  Eyes:     General: No scleral icterus.    Conjunctiva/sclera: Conjunctivae normal.  Cardiovascular:     Rate and Rhythm: Normal rate and regular rhythm.     Heart sounds: Normal heart sounds.  Pulmonary:     Effort: Pulmonary effort is normal. No respiratory distress.     Breath sounds: Normal breath sounds.  Abdominal:     Palpations: Abdomen is soft.     Tenderness: There is no abdominal tenderness.  Musculoskeletal:     Cervical back: Normal range of motion and neck supple.  Skin:    General: Skin is warm and dry.  Neurological:     Mental Status: He is alert.  Psychiatric:        Behavior: Behavior normal.  ED Results / Procedures / Treatments   Labs (all labs ordered are listed, but only abnormal results are displayed) Labs Reviewed  CBC  BASIC METABOLIC PANEL    EKG None  Radiology No results found.  Procedures Procedures    Medications Ordered in ED Medications - No data to display  ED Course/ Medical Decision Making/ A&P Clinical Course as of 10/08/22 1653  Sun Oct 08, 2022  1642 CT ABDOMEN PELVIS WO CONTRAST I personally visualized and interpreted the images using our PACS system. Acute findings include:  No radiopaque foreign bodies or evidence of obstruction  [AH]  1642 Basic metabolic panel(!) [AH]  1642 Glucose(!): 104 [AH]    Clinical Course User Index [AH] Arthor Captain, PA-C                             Medical Decision Making Amount and/or Complexity of Data Reviewed Labs: ordered. Decision-making details documented in ED  Course. Radiology: ordered. Decision-making details documented in ED Course.   2:38 PM Patient here with potential ingestion of highly absorbent polymer bead.  Plan is to have the patient do a oral contrast CT scan to look for foreign body or signs of obstruction, basic labs.  Patient is comfortable at this time.   4:57 PM Imaging has returned. Incidental nonobstructive calculus of the GB noted. No evidence of obstruction. Labs reassuring. Tolerating fluids. Plan dx with strict return precautions        Final Clinical Impression(s) / ED Diagnoses Final diagnoses:  None    Rx / DC Orders ED Discharge Orders     None         Arthor Captain, PA-C 10/08/22 1658    Rolan Bucco, MD 10/10/22 [phone]
